# Patient Record
Sex: Female | Born: 1970 | Race: White | Hispanic: No | Marital: Married | Smoking: Never smoker
Health system: Southern US, Community
[De-identification: ages and names within clinical notes are randomized; demographics above are authoritative.]

## PROBLEM LIST (undated history)

## (undated) DIAGNOSIS — R238 Other skin changes: Secondary | ICD-10-CM

## (undated) DIAGNOSIS — M5430 Sciatica, unspecified side: Secondary | ICD-10-CM

## (undated) DIAGNOSIS — G479 Sleep disorder, unspecified: Secondary | ICD-10-CM

## (undated) DIAGNOSIS — G629 Polyneuropathy, unspecified: Secondary | ICD-10-CM

## (undated) DIAGNOSIS — K219 Gastro-esophageal reflux disease without esophagitis: Secondary | ICD-10-CM

## (undated) DIAGNOSIS — R531 Weakness: Secondary | ICD-10-CM

## (undated) DIAGNOSIS — M419 Scoliosis, unspecified: Secondary | ICD-10-CM

## (undated) DIAGNOSIS — D649 Anemia, unspecified: Secondary | ICD-10-CM

## (undated) DIAGNOSIS — R109 Unspecified abdominal pain: Secondary | ICD-10-CM

## (undated) DIAGNOSIS — R519 Headache, unspecified: Secondary | ICD-10-CM

## (undated) DIAGNOSIS — I10 Essential (primary) hypertension: Secondary | ICD-10-CM

## (undated) DIAGNOSIS — K5900 Constipation, unspecified: Secondary | ICD-10-CM

## (undated) DIAGNOSIS — F319 Bipolar disorder, unspecified: Secondary | ICD-10-CM

## (undated) DIAGNOSIS — M179 Osteoarthritis of knee, unspecified: Secondary | ICD-10-CM

## (undated) DIAGNOSIS — R233 Spontaneous ecchymoses: Secondary | ICD-10-CM

## (undated) DIAGNOSIS — M1710 Unilateral primary osteoarthritis, unspecified knee: Secondary | ICD-10-CM

## (undated) DIAGNOSIS — M359 Systemic involvement of connective tissue, unspecified: Secondary | ICD-10-CM

## (undated) DIAGNOSIS — R51 Headache: Secondary | ICD-10-CM

## (undated) DIAGNOSIS — E119 Type 2 diabetes mellitus without complications: Secondary | ICD-10-CM

## (undated) DIAGNOSIS — M7989 Other specified soft tissue disorders: Secondary | ICD-10-CM

## (undated) DIAGNOSIS — K429 Umbilical hernia without obstruction or gangrene: Secondary | ICD-10-CM

## (undated) HISTORY — PX: HERNIA MESH REMOVAL: SHX1745

## (undated) HISTORY — DX: Unspecified abdominal pain: R10.9

## (undated) HISTORY — DX: Headache: R51

## (undated) HISTORY — PX: HERNIA REPAIR: SHX51

## (undated) HISTORY — DX: Weakness: R53.1

## (undated) HISTORY — DX: Headache, unspecified: R51.9

## (undated) HISTORY — DX: Other specified soft tissue disorders: M79.89

## (undated) HISTORY — DX: Constipation, unspecified: K59.00

## (undated) HISTORY — DX: Other skin changes: R23.8

## (undated) HISTORY — PX: KNEE SURGERY: SHX244

## (undated) HISTORY — PX: FOOT SURGERY: SHX648

## (undated) HISTORY — PX: OTHER SURGICAL HISTORY: SHX169

## (undated) HISTORY — DX: Spontaneous ecchymoses: R23.3

## (undated) HISTORY — PX: ABDOMINAL HYSTERECTOMY: SHX81

---

## 2014-01-14 DIAGNOSIS — K429 Umbilical hernia without obstruction or gangrene: Secondary | ICD-10-CM | POA: Insufficient documentation

## 2014-01-14 DIAGNOSIS — I10 Essential (primary) hypertension: Secondary | ICD-10-CM | POA: Insufficient documentation

## 2014-01-14 DIAGNOSIS — E119 Type 2 diabetes mellitus without complications: Secondary | ICD-10-CM | POA: Insufficient documentation

## 2014-01-15 ENCOUNTER — Encounter (HOSPITAL_COMMUNITY): Payer: Self-pay | Admitting: Emergency Medicine

## 2014-01-15 ENCOUNTER — Emergency Department (HOSPITAL_COMMUNITY)
Admission: EM | Admit: 2014-01-15 | Discharge: 2014-01-15 | Discharge status: Against Medical Advice | Payer: Medicaid Other | Attending: Emergency Medicine | Admitting: Emergency Medicine

## 2014-01-15 HISTORY — DX: Scoliosis, unspecified: M41.9

## 2014-01-15 HISTORY — DX: Umbilical hernia without obstruction or gangrene: K42.9

## 2014-01-15 HISTORY — DX: Osteoarthritis of knee, unspecified: M17.9

## 2014-01-15 HISTORY — DX: Unilateral primary osteoarthritis, unspecified knee: M17.10

## 2014-01-15 HISTORY — DX: Essential (primary) hypertension: I10

## 2014-01-15 HISTORY — DX: Bipolar disorder, unspecified: F31.9

## 2014-01-15 LAB — COMPREHENSIVE METABOLIC PANEL
ALBUMIN: 3.9 g/dL (ref 3.5–5.2)
ALT: 17 U/L (ref 0–35)
AST: 15 U/L (ref 0–37)
Alkaline Phosphatase: 68 U/L (ref 39–117)
BUN: 11 mg/dL (ref 6–23)
CALCIUM: 9.2 mg/dL (ref 8.4–10.5)
CO2: 24 mEq/L (ref 19–32)
Chloride: 102 mEq/L (ref 96–112)
Creatinine, Ser: 0.7 mg/dL (ref 0.50–1.10)
GFR calc non Af Amer: >90 mL/min (ref >90)
Glucose, Bld: 99 mg/dL (ref 70–99)
Potassium: 3.7 mEq/L (ref 3.7–5.3)
SODIUM: 139 meq/L (ref 137–147)
TOTAL PROTEIN: 7.9 g/dL (ref 6.0–8.3)
Total Bilirubin: 0.2 mg/dL — ABNORMAL LOW (ref 0.3–1.2)

## 2014-01-15 LAB — CBC WITH DIFFERENTIAL/PLATELET
BASOS PCT: 1 % (ref 0–1)
Basophils Absolute: 0.1 10*3/uL (ref 0.0–0.1)
EOS ABS: 0.2 10*3/uL (ref 0.0–0.7)
EOS PCT: 2 % (ref 0–5)
HCT: 35.4 % — ABNORMAL LOW (ref 36.0–46.0)
Hemoglobin: 11.9 g/dL — ABNORMAL LOW (ref 12.0–15.0)
LYMPHS ABS: 2.6 10*3/uL (ref 0.7–4.0)
Lymphocytes Relative: 31 % (ref 12–46)
MCH: 28.3 pg (ref 26.0–34.0)
MCHC: 33.6 g/dL (ref 30.0–36.0)
MCV: 84.3 fL (ref 78.0–100.0)
Monocytes Absolute: 0.8 10*3/uL (ref 0.1–1.0)
Monocytes Relative: 9 % (ref 3–12)
Neutro Abs: 4.7 10*3/uL (ref 1.7–7.7)
Neutrophils Relative %: 57 % (ref 43–77)
PLATELETS: 225 10*3/uL (ref 150–400)
RBC: 4.2 MIL/uL (ref 3.87–5.11)
RDW: 13.2 % (ref 11.5–15.5)
WBC: 8.3 10*3/uL (ref 4.0–10.5)

## 2014-01-15 LAB — URINALYSIS, ROUTINE W REFLEX MICROSCOPIC
Bilirubin Urine: NEGATIVE
Glucose, UA: NEGATIVE mg/dL
HGB URINE DIPSTICK: NEGATIVE
Ketones, ur: NEGATIVE mg/dL
Leukocytes, UA: NEGATIVE
Nitrite: NEGATIVE
Protein, ur: NEGATIVE mg/dL
SPECIFIC GRAVITY, URINE: 1.017 (ref 1.005–1.030)
UROBILINOGEN UA: 0.2 mg/dL (ref 0.0–1.0)
pH: 6.5 (ref 5.0–8.0)

## 2014-01-15 LAB — LIPASE, BLOOD: Lipase: 36 U/L (ref 11–59)

## 2014-01-15 LAB — PREGNANCY, URINE: PREG TEST UR: NEGATIVE

## 2014-01-15 NOTE — ED Notes (Signed)
Pt not in room when going in. Pt gown laying on bed. Gave time to see if pt would present back in room. Pt not in room. PA made aware.

## 2014-01-15 NOTE — ED Notes (Signed)
Pt reports having mesh removed in June and developing 3 umbilical hernias. Pt reports pain to umbilical pain to area, unable to sleep d/t to pain. Pt alert and family in room.

## 2014-02-07 ENCOUNTER — Encounter (HOSPITAL_COMMUNITY): Payer: Self-pay | Admitting: Emergency Medicine

## 2014-02-07 ENCOUNTER — Emergency Department (HOSPITAL_COMMUNITY)
Admission: EM | Admit: 2014-02-07 | Discharge: 2014-02-07 | Disposition: A | Discharge status: Home / Self Care | Payer: Medicaid Other | Attending: Emergency Medicine | Admitting: Emergency Medicine

## 2014-02-07 DIAGNOSIS — I10 Essential (primary) hypertension: Secondary | ICD-10-CM | POA: Insufficient documentation

## 2014-02-07 DIAGNOSIS — M79622 Pain in left upper arm: Secondary | ICD-10-CM

## 2014-02-07 DIAGNOSIS — IMO0002 Reserved for concepts with insufficient information to code with codable children: Secondary | ICD-10-CM | POA: Insufficient documentation

## 2014-02-07 DIAGNOSIS — E119 Type 2 diabetes mellitus without complications: Secondary | ICD-10-CM | POA: Insufficient documentation

## 2014-02-07 DIAGNOSIS — M1710 Unilateral primary osteoarthritis, unspecified knee: Secondary | ICD-10-CM | POA: Insufficient documentation

## 2014-02-07 DIAGNOSIS — M79609 Pain in unspecified limb: Secondary | ICD-10-CM | POA: Insufficient documentation

## 2014-02-07 DIAGNOSIS — Z87891 Personal history of nicotine dependence: Secondary | ICD-10-CM | POA: Insufficient documentation

## 2014-02-07 DIAGNOSIS — Z880 Allergy status to penicillin: Secondary | ICD-10-CM | POA: Insufficient documentation

## 2014-02-07 DIAGNOSIS — M4120 Other idiopathic scoliosis, site unspecified: Secondary | ICD-10-CM | POA: Insufficient documentation

## 2014-02-07 DIAGNOSIS — R209 Unspecified disturbances of skin sensation: Secondary | ICD-10-CM | POA: Insufficient documentation

## 2014-02-07 DIAGNOSIS — F319 Bipolar disorder, unspecified: Secondary | ICD-10-CM | POA: Insufficient documentation

## 2014-02-07 MED ORDER — OXYCODONE-ACETAMINOPHEN 5-325 MG PO TABS
2.0000 | ORAL_TABLET | Freq: Once | ORAL | Status: AC
  Administered 2014-02-07: 2 via ORAL
  Filled 2014-02-07: qty 2

## 2014-02-07 MED ORDER — OXYCODONE-ACETAMINOPHEN 5-325 MG PO TABS: 1.0000 | ORAL_TABLET | ORAL | Status: DC | PRN

## 2014-02-07 MED ORDER — IBUPROFEN 200 MG PO TABS
600.0000 mg | ORAL_TABLET | Freq: Once | ORAL | Status: AC
  Administered 2014-02-07: 600 mg via ORAL
  Filled 2014-02-07: qty 3

## 2014-02-07 NOTE — Discharge Instructions (Signed)
You need to follow-up for your ultrasound tomorrow. See contact information for [facility]. See below resources for primary care.    Emergency Department Resource Guide 1) Find a Doctor and Pay Out of Pocket Although you won't have to find out who is covered by your insurance plan, it is a good idea to ask around and get recommendations. You will then need to call the office and see if the doctor you have chosen will accept you as a new patient and what types of options they offer for patients who are self-pay. Some doctors offer discounts or will set up payment plans for their patients who do not have insurance, but you will need to ask so you aren't surprised when you get to your appointment.  2) Contact Your Local Health Department Not all health departments have doctors that can see patients for sick visits, but many do, so it is worth a call to see if yours does. If you don't know where your local health department is, you can check in your phone book. The CDC also has a tool to help you locate your state's health department, and many state websites also have listings of all of their local health departments.  3) Find a Walk-in Clinic If your illness is not likely to be very severe or complicated, you may want to try a walk in clinic. These are popping up all over the country in pharmacies, drugstores, and shopping centers. They're usually staffed by nurse practitioners or physician assistants that have been trained to treat common illnesses and complaints. They're usually fairly quick and inexpensive. However, if you have serious medical issues or chronic medical problems, these are probably not your best option.  No Primary Care Doctor: - Call Health Connect at  [phone] - they can help you locate a primary care doctor that  accepts your insurance, provides certain services, etc. - Physician Referral Service- [phone]  Chronic Pain Problems: Organization          Address  Phone   Notes  [facility]  [phone]) [phone] Patients need to be referred by their primary care doctor.   Medication Assistance: Organization         Address  Phone   Notes  [facility]ication Primary [facility] Program [address] [place], [place] [address] [phone] --Must be a resident of [facility] -- Must have NO insurance coverage whatsoever (no Medicaid/ Medicare, etc.) -- The pt. MUST have a primary care doctor that directs their care regularly and follows them in the community   MedAssist  [phone]) [phone]   [employer]  [phone]    Agencies that provide inexpensive medical care: Organization         Address  Phone   Notes  [facility]  [phone]) [phone]   [facility]    [phone]) [phone]   [facility] [address] [place], [place] [address] [phone]   [facility] [address], [place] [phone]) [phone]   Planned Parenthood    [phone](336) [phone]   [facility]    [phone]   [facility]  [address], [place] Phone:  [phone], Fax:  [phone] Hours of Operation:  9 am - 6 pm, M-F.  Also accepts Medicaid/Medicare and self-pay.  [facility]  [address], [place] Phone: [phone](336) [phone],  Fax: [phone]. Hours of Operation:  8:30 am - 5:30 pm, M-F.  Also accepts Medicaid and self-pay.  [facility] [address], [place] Phone: [phone]   [place], [place], [place] [phone], Ext. [phone] Mondays & Thursdays: 7-9 AM.  First 15 patients are seen on a first come, first serve basis.    [place] Providers:  Organization         Address  Phone   Notes  [place] [address], [place] [phone] Also accepts self-pay patients.  [facility] [address], [place]  [phone]   [place], [address], [place] [phone]   [facility] [address] [place] [phone]   Lucianne Lei [address], [place]   [phone] Only accepts [place] Access [place] patients after they have their name applied to their card.   Self-Pay (no insurance) in [facility]:  Organization         Address  Phone   Notes  Sickle Cell Patients, [facility] [place] [phone]   [facility] [place] [phone]   [facility] [place]  [place], [place], [place] [phone]   [facility]/Dr. Osei-Bonsu  [address], [place] or [address], [place] [phone] Phone number for both [place] and [place] locations is the same.  [facility] [address], [place] 5 [phone]   [facility] [address] [place] or [address] [phone]   [facility] [address] [place] [phone], phone; [phone], fax Sees patients 1st and 3rd Saturday of every month.  Must not qualify for public or private insurance (i.e. Medicaid, Medicare, [place] Health Choice, Veterans' Benefits)  Household income should be no more than 200% of the poverty level The clinic cannot treat you if you are pregnant or think you are pregnant  Sexually transmitted diseases are not treated at the clinic.    Dental Care: Organization         Address  Phone  Notes  [facility] [place] [phone] Accepts children up to age 36 who are enrolled in [place] or [place]; pregnant women with a Medicaid card; and  children who have applied for Medicaid or [place] Health Choice, but were declined, whose parents can pay a reduced fee at time of service.  [facility] Department of [facility]  [address], [place] [phone] Accepts children up to age 67 who are enrolled in [place] or [place]; pregnant women with a Medicaid card; and children who have applied for Medicaid or [place] Health Choice, but were declined, whose parents can pay a reduced fee at time of service.  [place] Adult Dental Access PROGRAM  [place] [phone] Patients are seen by appointment only. Walk-ins are not accepted. [place] will see patients 26 years of age and older. Monday - Tuesday (8am-5pm) Most Wednesdays (8:30-5pm) $30 per visit, cash only  [place] [facility] [employer] PROGRAM  [address], [employer] (  [phone] Patients are seen by appointment only. Walk-ins are not accepted. [place] will see patients 54 years of age and older. One Wednesday Evening (Monthly: Volunteer Based).  $30 per visit, cash only  [place]  [phone] for adults; Children under age 63, call Graduate Pediatric Dentistry at [phone]. Children aged 44-14, please call [phone] to request a pediatric application.  Dental services are provided in all areas of dental care including fillings, crowns and bridges, complete and partial dentures, implants, gum treatment, root canals, and extractions. Preventive care is also provided. Treatment is provided to both adults and children. Patients are selected via a lottery and there is often a waiting list.   [facility] [address] [place]  [phone] [email or web address]   [facility] [address] [place], [place] [phone], Ext. [phone] Second and Fourth Thursday of each month, opens at 6:30 AM; Clinic ends at 9 AM.  Patients are seen on a first-come first-served  basis, and a limited number are seen during each clinic.   [facility]  [address] Hillard Danker [place], [place] [phone]   Eligibility Requirements You must have lived in [place], [place], or [place] counties for at least the last three months.   You cannot be eligible for state or federal sponsored [employer], including [employer], [place], or [employer].   You generally cannot be eligible for healthcare insurance through your employer.    How to apply: Eligibility screenings are held every Tuesday and Wednesday afternoon from 1:00 pm until 4:00 pm. You do not need an appointment for the interview!  [facility] [address], [place], [place]   [place]  [place] Department  [place]  [phone]    Behavioral Health Resources in the Community: Intensive Outpatient Programs Organization         Address  Phone  Notes  [place]. [address], [place], [place] [phone]   [facility] [address] [place], [place]   ADS: Alcohol & Drug Svcs [address], [place], [place]   [place] [address]  [place], [place] or [phone]   Substance Abuse Resources Organization         Address  Phone  Notes  Alcohol and Drug Services  [phone]   [place]  [phone]   The [place]   Chinita Pester  [phone]   Residential & Outpatient Substance Abuse Program  [phone]   Psychological Services Organization         Address  Phone  Notes  [facility] [place]  [place]  [phone]   [place] [address], [place] or [phone]    Mobile Crisis Teams Organization          Address  Phone  Notes  [facility] Alternatives, Mobile Crisis Care Unit  [phone]   [facility]ive Psychotherapeutic Services  [address] [place], [place]   [place] [address] [place] [phone]    Self-Help/Support Groups Organization         Address  Phone             Notes  [place]. of [place] - variety of support groups  336- H3156881 Call for more information  Narcotics Anonymous (NA), [facility] [address], [employer] [place]  2 meetings at this location   Residential Facilities manager         Address  Phone  Notes  [facility] [place],    [place]  [phone]   [facility]  [address], [place] [id number], [place], [place]   [place], [place] [phone] Admissions: 8am-3pm M-F  Incentives Substance [place] [address]    [employer], [place] [phone]   [facility] [address] [place], [place], [place]   [facility] [address],  [place], [place]   [facility] Programs - Intensive Outpatient [address] Kristeen [address], [place], [place]   [facility] ([place].,  [place], [place] [phone] or [phone]   [facility] Services (RTS) [address], [place], [place] Accepts Medicaid  [facility] [address]  [place] [phone] Substance Abuse/Addiction Treatment   [facility] Organization         Address  Phone  Notes  [facility]  [phone]   Domenic Schwab, PhD [address] Arlis Porta [place], [place]   [phone] or [phone]   [place], [place] [phone]   [facility] [address], [place], [place] [phone] Insurance/Medicaid/sponsorship  through [facility] Families [address]                                    [place], [place] [phone] [place] [address], [place] [phone]    Dr. Adele Schilder  [phone]   [facility] Dept. 1) [address] [place] 2) [place] 3)  [place] [address], [place] [phone]  [phone]   [place] [phone] or [phone] (After Hours)

## 2014-02-07 NOTE — ED Notes (Signed)
Pt presents with c/o abdominal pain, extremity numbness, and left arm pain. Pt had abdominal surgery 06/14 to take mesh out that was recalled and remove some abdominal hernias. Pt says she is having pain in the area where the surgery was done. Pt also c/o numbness and tingling in her toes that has been going on for about 2 weeks. Pt also concerned about a possible infection in her upper left arm. Pt says her husband noticed a red streak coming down her arm. Pt unsure where the pain is coming from.

## 2014-02-08 ENCOUNTER — Encounter (HOSPITAL_COMMUNITY): Payer: Self-pay | Admitting: Emergency Medicine

## 2014-02-08 ENCOUNTER — Emergency Department (HOSPITAL_COMMUNITY)
Admission: EM | Admit: 2014-02-08 | Discharge: 2014-02-09 | Disposition: A | Discharge status: Home / Self Care | Payer: Medicaid Other | Attending: Emergency Medicine | Admitting: Emergency Medicine

## 2014-02-08 ENCOUNTER — Ambulatory Visit (HOSPITAL_COMMUNITY): Admission: RE | Admit: 2014-02-08 | Payer: Medicaid Other | Source: Ambulatory Visit

## 2014-02-08 DIAGNOSIS — E119 Type 2 diabetes mellitus without complications: Secondary | ICD-10-CM | POA: Insufficient documentation

## 2014-02-08 DIAGNOSIS — Z791 Long term (current) use of non-steroidal anti-inflammatories (NSAID): Secondary | ICD-10-CM | POA: Insufficient documentation

## 2014-02-08 DIAGNOSIS — Z880 Allergy status to penicillin: Secondary | ICD-10-CM | POA: Insufficient documentation

## 2014-02-08 DIAGNOSIS — Z87891 Personal history of nicotine dependence: Secondary | ICD-10-CM | POA: Insufficient documentation

## 2014-02-08 DIAGNOSIS — M1710 Unilateral primary osteoarthritis, unspecified knee: Secondary | ICD-10-CM | POA: Insufficient documentation

## 2014-02-08 DIAGNOSIS — M79609 Pain in unspecified limb: Secondary | ICD-10-CM | POA: Insufficient documentation

## 2014-02-08 DIAGNOSIS — Z8719 Personal history of other diseases of the digestive system: Secondary | ICD-10-CM | POA: Insufficient documentation

## 2014-02-08 DIAGNOSIS — M79602 Pain in left arm: Secondary | ICD-10-CM

## 2014-02-08 DIAGNOSIS — Z8659 Personal history of other mental and behavioral disorders: Secondary | ICD-10-CM | POA: Insufficient documentation

## 2014-02-08 DIAGNOSIS — R519 Headache, unspecified: Secondary | ICD-10-CM

## 2014-02-08 DIAGNOSIS — I10 Essential (primary) hypertension: Secondary | ICD-10-CM | POA: Insufficient documentation

## 2014-02-08 DIAGNOSIS — IMO0002 Reserved for concepts with insufficient information to code with codable children: Secondary | ICD-10-CM | POA: Insufficient documentation

## 2014-02-08 DIAGNOSIS — H53149 Visual discomfort, unspecified: Secondary | ICD-10-CM | POA: Insufficient documentation

## 2014-02-08 DIAGNOSIS — R51 Headache: Secondary | ICD-10-CM | POA: Insufficient documentation

## 2014-02-08 MED ORDER — SODIUM CHLORIDE 0.9 % IV BOLUS (SEPSIS)
1000.0000 mL | Freq: Once | INTRAVENOUS | Status: AC
  Administered 2014-02-09: 1000 mL via INTRAVENOUS

## 2014-02-08 MED ORDER — FENTANYL CITRATE 0.05 MG/ML IJ SOLN
50.0000 ug | Freq: Once | INTRAMUSCULAR | Status: AC
  Administered 2014-02-09: 50 ug via INTRAVENOUS
  Filled 2014-02-08: qty 2

## 2014-02-08 MED ORDER — ONDANSETRON HCL 4 MG/2ML IJ SOLN
4.0000 mg | Freq: Once | INTRAMUSCULAR | Status: AC
  Administered 2014-02-09: 4 mg via INTRAVENOUS
  Filled 2014-02-08: qty 2

## 2014-02-08 NOTE — ED Notes (Signed)
Pt reports [facility] and EDP suggested she may have a blood clot in her left arm. Was scheduled for vascular study this morning but missed her appt. Pt reports she called and made appt for Monday but now she feels more dizzy and lightheaded and has vomited about 5 times with increasing pain to left arm. Left arm appears swollen at [facility]. Cap refill <3 and radial pulse palpable.

## 2014-02-08 NOTE — ED Provider Notes (Signed)
CSN: [id number]     Arrival date & time 02/08/14  2132 History   First MD Initiated Contact with Patient 02/08/14 2325     Chief Complaint  Patient presents with  . Arm Pain     (Consider location/radiation/quality/duration/timing/severity/associated sxs/prior Treatment) HPI  Patient is a 43 yo woman with DM, HTN, bipolar disorder. She presents after vomiting about 6 times throughout the day today with difficulty keeping po intake.  She denies abdominal pain and diarrhea. [facility] and was given "a pill". She thinks the nurse that gave her the pill dropped the pill on the ground. Denies GUsx.   Patient says [facility] after being evaluated for left arm pain and was told to come back for a  Vascular u/s this morning. However, she overslept and missed the appt. She says she needs the u/s. She says she can feel a lump in her arm. She denies h/o trauma. Pain is 3/10, intermittent. She says her husband thought that her veins were "popping out" and that her husband thought she had a blood clot and needed to go to the hospital.   Patient also complains of a migraine headache which began about 4h ago. She has mild photophobia.   Past Medical History  Diagnosis Date  . Umbilical hernia   . Diabetes mellitus without complication   . Hypertension   . Bipolar disorder   . Scoliosis   . OA (osteoarthritis) of knee    Past Surgical History  Procedure Laterality Date  . Hernia mesh removal    . Abdominal hysterectomy    . Knee surgery     No family history on file. History  Substance Use Topics  . Smoking status: Former Games developermoker  . Smokeless tobacco: Not on file  . Alcohol Use: No   OB History   Grav Para Term Preterm Abortions TAB SAB Ect Mult Living                 Review of Systems  Ten point review of symptoms performed and is negative with the exception of symptoms noted above.    Allergies  Celebrex; Keflex; Penicillins;  and Ultram  Home Medications   Current Outpatient Rx  Name  Route  Sig  Dispense  Refill  . hydrOXYzine (ATARAX/VISTARIL) 25 MG tablet   Oral   Take 25 mg by mouth at bedtime.         . naproxen (NAPROSYN) 500 MG tablet   Oral   Take 500 mg by mouth 2 (two) times daily with a meal.         . oxyCODONE-acetaminophen (PERCOCET/ROXICET) 5-325 MG per tablet   Oral   Take 1-2 tablets by mouth every 4 (four) hours as needed for severe pain.   12 tablet   0    BP 130/77  Pulse 74  Temp(Src) 98.1 F (36.7 C) (Oral)  Resp 18  Ht 5\' 4"  (1.626 m)  Wt 211 lb 3 oz (95.794 kg)  BMI 36.23 kg/m2  SpO2 100% Physical Exam Gen: well developed and well nourished appearing Head: NCAT Eyes: PERL, EOMI Nose: no epistaixis or rhinorrhea Mouth/throat: mucosa is moist and pink, edentulous Neck: supple, no stridor Lungs: CTA B, no wheezing, rhonchi or rales CV: RRR, no murmur, extremities appear well perfused.  Abd: soft, notender, nondistended Back: no ttp, no cva ttp Skin: warm and dry Ext: normal to inspection, no masses appreciated, ttp  over the distal anterior left upper arm, skin without lesions, FROM without pain right shoulder, elbow, wrist. no dependent edema Neuro: CN ii-xii grossly intact, no focal deficits Psyche;  flataffect,  calm and cooperative.   ED Course  Procedures (including critical care time)   MDM   Patient with complaint of migraine headache and left arm pain with ttp and personal suspicion for a DVT. The patient has no risk factors for a DVT and I see no clinical signs to make me suspect DVT. We will manage headache related pain with IVF, fentanyl, Zofran.  Patient will need to return later in the day for a duplex u/s of the left arm to rule out DVT.    Brandt Loosen, MD 02/09/14 [phone]

## 2014-02-08 NOTE — ED Notes (Signed)
The pt has had lt arm pain for 3 days.  [facility] and she was supposed to have a us this am but she missed her appointment this am. She thinks her lt arm is more swollen and she has been vomiting since she has been taking the percocet for pain

## 2014-02-09 ENCOUNTER — Ambulatory Visit (HOSPITAL_COMMUNITY)
Admission: EM | Admit: 2014-02-09 | Discharge: 2014-02-09 | Disposition: A | Discharge status: Home / Self Care | Payer: Medicaid Other | Source: Other Acute Inpatient Hospital | Attending: Emergency Medicine | Admitting: Emergency Medicine

## 2014-02-09 NOTE — ED Notes (Signed)
Pt A&Ox4, ambulatory at discharge, verbalizing no complaints at this time. 

## 2014-02-09 NOTE — Discharge Instructions (Signed)
PLEASE RETURN TO THE  HOSPITAL REGISTRATION DESK AT 10AM FOR VASCULAR ULTRASOUND OF THE LEFT ARM.

## 2014-02-11 ENCOUNTER — Ambulatory Visit (HOSPITAL_COMMUNITY): Payer: Medicaid Other | Attending: Emergency Medicine

## 2014-02-13 NOTE — ED Provider Notes (Signed)
CSN: [id number]     Arrival date & time 02/07/14  1923 History   First MD Initiated Contact with Patient 02/07/14 1947     Chief Complaint  Patient presents with  . Abdominal Pain  . Extremity numbness   . Arm Pain     (Consider location/radiation/quality/duration/timing/severity/associated sxs/prior Treatment) HPI 43 year old female multiple complaints. Chief complaint seems to be atraumatic left upper extremity pain. She reports that her husband pointed out that her left upper arm appear to be swollen and red streaks extending up it. She felt a mass in her antecubital area which he says has since resolved. Redness has improved. She still having a heat pain extending from just above her elbow radiating to her shoulder region. Denies any history of blood clot. No fevers or chills. No chest pain or shortness of breath. No unusual leg pain or swelling.   Past Medical History  Diagnosis Date  . Umbilical hernia   . Diabetes mellitus without complication   . Hypertension   . Bipolar disorder   . Scoliosis   . OA (osteoarthritis) of knee    Past Surgical History  Procedure Laterality Date  . Hernia mesh removal    . Abdominal hysterectomy    . Knee surgery     No family history on file. History  Substance Use Topics  . Smoking status: Former Games developermoker  . Smokeless tobacco: Not on file  . Alcohol Use: No   OB History   Grav Para Term Preterm Abortions TAB SAB Ect Mult Living                 Review of Systems  All systems reviewed and negative, other than as noted in HPI.   Allergies  Celebrex; Keflex; Penicillins; and Ultram  Home Medications   Prior to Admission medications   Medication Sig Start Date End Date Taking? Authorizing Provider  hydrOXYzine (ATARAX/VISTARIL) 25 MG tablet Take 25 mg by mouth at bedtime.   Yes Historical Provider, MD  naproxen (NAPROSYN) 500 MG tablet Take 500 mg by mouth 2 (two) times daily with a meal.   Yes Historical Provider, MD   oxyCODONE-acetaminophen (PERCOCET/ROXICET) 5-325 MG per tablet Take 1-2 tablets by mouth every 4 (four) hours as needed for severe pain. 02/07/14   Raeford RazorStephen Katriel Cutsforth, MD   BP 121/85  Pulse 87  Temp(Src) 98.7 F (37.1 C) (Oral)  Resp 18  SpO2 99% Physical Exam  Nursing note and vitals reviewed. Constitutional: She appears well-developed and well-nourished. No distress.  HENT:  Head: Normocephalic and atraumatic.  Eyes: Conjunctivae are normal. Right eye exhibits no discharge. Left eye exhibits no discharge.  Neck: Neck supple.  Cardiovascular: Normal rate, regular rhythm and normal heart sounds.  Exam reveals no gallop and no friction rub.   No murmur heard. Pulmonary/Chest: Effort normal and breath sounds normal. No respiratory distress.  Abdominal: Soft. She exhibits no distension. There is no tenderness.  Well-healed abdominal incisions. No tenderness. No distention.  Musculoskeletal: She exhibits no edema and no tenderness.  Left upper extremity is symmetric as compared to the right. No redness/streaking or other concerning skin changes noted. Radian, median and ulnar function is intact. Brisk cap refill of the fingers. Strength is 5 out of 5 with arm flexion and extension. Do not feel any mass or palpate any cords.  Neurological: She is alert.  Skin: Skin is warm and dry.  Psychiatric: She has a normal mood and affect. Her behavior is normal. Thought content normal.  ED Course  Procedures (including critical care time) Labs Review Labs Reviewed - No data to display  Imaging Review No results found.   EKG Interpretation None      MDM   Final diagnoses:  Left upper arm pain    43 year old female with atraumatic left upper extremity pain. She is a concern for possible blood clot. Clinically there are no signs of this. Unfortunately, unable to obtain a duplex ultrasound at this time a day. Arranged for patient return tomorrow to obtain this.    Raeford RazorStephen Wissam Resor,  MD 02/13/14 [phone]

## 2014-04-03 ENCOUNTER — Telehealth: Payer: Self-pay | Admitting: General Practice

## 2014-04-03 DIAGNOSIS — F419 Anxiety disorder, unspecified: Secondary | ICD-10-CM | POA: Insufficient documentation

## 2014-04-03 NOTE — Telephone Encounter (Signed)
Left message for patient to call to schedule appointment to establish care.  °

## 2014-04-22 ENCOUNTER — Emergency Department (HOSPITAL_COMMUNITY)
Admission: EM | Admit: 2014-04-22 | Discharge: 2014-04-22 | Discharge status: Against Medical Advice | Payer: Medicaid Other | Attending: Emergency Medicine | Admitting: Emergency Medicine

## 2014-04-22 ENCOUNTER — Encounter (HOSPITAL_COMMUNITY): Payer: Self-pay | Admitting: Emergency Medicine

## 2014-04-22 DIAGNOSIS — K429 Umbilical hernia without obstruction or gangrene: Secondary | ICD-10-CM | POA: Insufficient documentation

## 2014-04-22 DIAGNOSIS — E119 Type 2 diabetes mellitus without complications: Secondary | ICD-10-CM | POA: Insufficient documentation

## 2014-04-22 DIAGNOSIS — I10 Essential (primary) hypertension: Secondary | ICD-10-CM | POA: Insufficient documentation

## 2014-04-22 NOTE — ED Notes (Signed)
Pt states "I am going to go home, the wait is too long"; RN advised that we will be glad to see her and would like her to stay; pt declines to stay; pt aware of risks of leaving AMA

## 2014-04-22 NOTE — ED Notes (Signed)
Pt presents with c/o umbilical hernia that is causing her pain. Pt had abdominal surgery in June of last year. Pt started to notice a hernia about 4 months after the surgery. Pt recently noticed the hernia was red and swollen and tender to touch and was told to come here by her doctor.

## 2014-04-23 ENCOUNTER — Telehealth (INDEPENDENT_AMBULATORY_CARE_PROVIDER_SITE_OTHER): Payer: Self-pay

## 2014-04-23 ENCOUNTER — Ambulatory Visit (INDEPENDENT_AMBULATORY_CARE_PROVIDER_SITE_OTHER): Payer: Medicaid Other | Admitting: Surgery

## 2014-04-23 NOTE — Telephone Encounter (Signed)
Person called stating she is having severe pain at hernia site with redness and bulging. She states she was in the ER last night and waited 5 hours then left without being seen. I advised pt to go back to ER to have hernia evaluated to r/o incarceration. I stressed the importance of staying to be seen. She states she understands and will go back to ER.

## 2014-05-01 DIAGNOSIS — R202 Paresthesia of skin: Secondary | ICD-10-CM | POA: Insufficient documentation

## 2014-05-01 DIAGNOSIS — M25579 Pain in unspecified ankle and joints of unspecified foot: Secondary | ICD-10-CM | POA: Insufficient documentation

## 2014-05-07 DIAGNOSIS — K219 Gastro-esophageal reflux disease without esophagitis: Secondary | ICD-10-CM | POA: Insufficient documentation

## 2014-05-07 DIAGNOSIS — R768 Other specified abnormal immunological findings in serum: Secondary | ICD-10-CM | POA: Insufficient documentation

## 2014-05-10 ENCOUNTER — Other Ambulatory Visit (INDEPENDENT_AMBULATORY_CARE_PROVIDER_SITE_OTHER): Payer: Self-pay

## 2014-05-10 ENCOUNTER — Encounter (INDEPENDENT_AMBULATORY_CARE_PROVIDER_SITE_OTHER): Payer: Self-pay | Admitting: Surgery

## 2014-05-10 ENCOUNTER — Ambulatory Visit (INDEPENDENT_AMBULATORY_CARE_PROVIDER_SITE_OTHER): Payer: Medicaid Other | Admitting: Surgery

## 2014-05-10 VITALS — BP 110/78 | HR 64 | Temp 98.2°F | Resp 14 | Ht 63.5 in | Wt 205.0 lb

## 2014-05-10 DIAGNOSIS — K439 Ventral hernia without obstruction or gangrene: Secondary | ICD-10-CM

## 2014-05-10 DIAGNOSIS — K429 Umbilical hernia without obstruction or gangrene: Secondary | ICD-10-CM

## 2014-05-10 DIAGNOSIS — E119 Type 2 diabetes mellitus without complications: Secondary | ICD-10-CM | POA: Insufficient documentation

## 2014-05-10 DIAGNOSIS — K469 Unspecified abdominal hernia without obstruction or gangrene: Secondary | ICD-10-CM | POA: Insufficient documentation

## 2014-05-10 MED ORDER — OXYCODONE-ACETAMINOPHEN 5-325 MG PO TABS: 1.0000 | ORAL_TABLET | ORAL | Status: DC | PRN

## 2014-05-10 NOTE — Progress Notes (Signed)
Chief Complaint:  Pain in recurrent hernia  History of Present Illness:  Jennifer Barajas is an 43 y.o. female who had an 8 cm round Ventralex [place] in [place].  She developed a recurrent and pain and the mesh was [place] in [place] in 2014.  She has been seen and is complaining of pain and a bulge below these hernia repair attempts.    Past Medical History  Diagnosis Date  . Umbilical hernia   . Diabetes mellitus without complication   . Hypertension   . Bipolar disorder   . Scoliosis   . OA (osteoarthritis) of knee   . Abdominal pain   . Generalized headaches   . Leg swelling   . Constipation   . Diarrhea   . Nausea & vomiting   . Weakness   . Easy bruising     Past Surgical History  Procedure Laterality Date  . Hernia mesh removal    . Abdominal hysterectomy    . Knee surgery      Current Outpatient Prescriptions  Medication Sig Dispense Refill  . hydrOXYzine (ATARAX/VISTARIL) 25 MG tablet Take 25 mg by mouth at bedtime.       No current facility-administered medications for this visit.   Celebrex; Keflex; Penicillins; and Ultram History reviewed. No pertinent family history. Social History:   reports that she has quit smoking. She does not have any smokeless tobacco history on file. She reports that she does not drink alcohol or use illicit drugs.   REVIEW OF SYSTEMS : Positive for constipation since her hernia repair and see problem list ; otherwise negative  Physical Exam:   Blood pressure 110/78, pulse 64, temperature 98.2 F (36.8 C), temperature source Oral, resp. rate 14, height 5' 3.5" (1.613 m), weight 205 lb (92.987 kg). Body mass index is 35.74 kg/(m^2).  Gen:  WDWN WF NAD  Neurological: Alert and oriented to person, place, and time. Motor and sensory function is grossly intact  Head: Normocephalic and atraumatic.  Eyes: Conjunctivae are normal. Pupils are equal, round, and reactive to light. No scleral icterus.  Neck: Normal  range of motion. Neck supple. No tracheal deviation or thyromegaly present.  Cardiovascular:  SR without murmurs or gallops.  No carotid bruits Breast:  Not examined Respiratory: Effort normal.  No respiratory distress. No chest wall tenderness. Breath sounds normal.  No wheezes, rales or rhonchi.  Abdomen:  Tender bulge below the transverse incision consistent with properitoneal fat GU:  Not examined Musculoskeletal: Normal range of motion. Extremities are nontender. No cyanosis, edema or clubbing noted Lymphadenopathy: No cervical, preauricular, postauricular or axillary adenopathy is present Skin: Skin is warm and dry. No rash noted. No diaphoresis. No erythema. No pallor. Pscyh: Normal mood and affect. Behavior is normal. Judgment and thought content normal.   LABORATORY RESULTS: No results found for this or any previous visit (from the past 48 hour(s)).   RADIOLOGY RESULTS: No results found.  Problem List: Patient Active Problem List   Diagnosis Date Noted  . Diabetes 05/10/2014  . recurrent ventral hernia 05/10/2014    Assessment & Plan: Recurrent ventral hernia.  Patient does not want mesh reimplanted and I informed her that she may have a higher incidence of recurrence.  Will obtain a CT to assess what is involved with this recurrent hernia and see back after the CT to discuss repair.     Matt B. Daphine DeutscherMartin, MD, [facility]  [facility] [phone] beeper [phone]  05/10/2014 12:10  PM

## 2014-05-10 NOTE — Patient Instructions (Signed)
Ventral Hernia  A ventral hernia (also called an incisional hernia) is a hernia that occurs at the site of a previous surgical cut (incision) in the abdomen. The abdominal wall spans from your lower chest down to your pelvis. If the abdominal wall is weakened from a surgical incision, a hernia can occur. A hernia is a bulge of bowel or muscle tissue pushing out on the weakened part of the abdominal wall. Ventral hernias can get bigger from straining or lifting.  Obese and older people are at higher risk for a ventral hernia. People who develop infections after surgery or require repeat incisions at the same site on the abdomen are also at increased risk.  CAUSES   A ventral hernia occurs because of weakness in the abdominal wall at an incision site.   SYMPTOMS   Common symptoms include:   A visible bulge or lump on the abdominal wall.   Pain or tenderness around the lump.   Increased discomfort if you cough or make a sudden movement.  If the hernia has blocked part of the intestine, a serious complication can occur (incarcerated or strangulated hernia). This can become a problem that requires emergency surgery because the blood flow to the blocked intestine may be cut off. Symptoms may include:   Feeling sick to your stomach (nauseous).   Throwing up (vomiting).   Stomach swelling (distention) or bloating.   Fever.   Rapid heartbeat.  DIAGNOSIS   Your caregiver will take a medical history and perform a physical exam. Various tests may be ordered, such as:   Blood tests.   Urine tests.   Ultrasonography.   X-rays.   Computed tomography (CT).  TREATMENT   Watchful waiting may be all that is needed for a smaller hernia that does not cause symptoms. Your caregiver may recommend the use of a supportive belt (truss) that helps to keep the abdominal wall intact. For larger hernias or those that cause pain, surgery to repair the hernia is usually recommended. If a hernia becomes strangulated, emergency surgery  needs to be done right away.  HOME CARE INSTRUCTIONS   Avoid putting pressure or strain on the abdominal area.   Avoid heavy lifting.   Use good body positioning for physical tasks. Ask your caregiver about proper body positioning.   Use a supportive belt as directed by your caregiver.   Maintain a healthy weight.   Eat foods that are high in fiber, such as whole grains, fruits, and vegetables. Fiber helps prevent difficult bowel movements (constipation).   Drink enough fluids to keep your urine clear or pale yellow.   Follow up with your caregiver as directed.  SEEK MEDICAL CARE IF:    Your hernia seems to be getting larger or more painful.  SEEK IMMEDIATE MEDICAL CARE IF:    You have abdominal pain that is sudden and sharp.   Your pain becomes severe.   You have repeated vomiting.   You are sweating a lot.   You notice a rapid heartbeat.   You develop a fever.  MAKE SURE YOU:    Understand these instructions.   Will watch your condition.   Will get help right away if you are not doing well or get worse.  Document Released: 10/04/2012 Document Reviewed: 10/04/2012  ExitCare Patient Information 2015 [employer]. This information is not intended to replace advice given to you by your health care provider. Make sure you discuss any questions you have with your health care provider.

## 2014-05-15 DIAGNOSIS — E559 Vitamin D deficiency, unspecified: Secondary | ICD-10-CM | POA: Insufficient documentation

## 2014-05-15 DIAGNOSIS — IMO0002 Reserved for concepts with insufficient information to code with codable children: Secondary | ICD-10-CM | POA: Insufficient documentation

## 2014-05-16 ENCOUNTER — Emergency Department (HOSPITAL_COMMUNITY)
Admission: EM | Admit: 2014-05-16 | Discharge: 2014-05-16 | Disposition: A | Discharge status: Home / Self Care | Payer: Medicaid Other | Attending: Emergency Medicine | Admitting: Emergency Medicine

## 2014-05-16 ENCOUNTER — Encounter (HOSPITAL_COMMUNITY): Payer: Self-pay

## 2014-05-16 ENCOUNTER — Telehealth (INDEPENDENT_AMBULATORY_CARE_PROVIDER_SITE_OTHER): Payer: Self-pay | Admitting: Surgery

## 2014-05-16 ENCOUNTER — Ambulatory Visit (HOSPITAL_COMMUNITY)
Admission: RE | Admit: 2014-05-16 | Discharge: 2014-05-16 | Disposition: A | Discharge status: Home / Self Care | Payer: Medicaid Other | Source: Ambulatory Visit | Attending: Family Medicine | Admitting: Family Medicine

## 2014-05-16 ENCOUNTER — Telehealth (INDEPENDENT_AMBULATORY_CARE_PROVIDER_SITE_OTHER): Payer: Self-pay

## 2014-05-16 ENCOUNTER — Emergency Department (HOSPITAL_COMMUNITY): Payer: Medicaid Other

## 2014-05-16 ENCOUNTER — Encounter (HOSPITAL_COMMUNITY): Payer: Self-pay | Admitting: Emergency Medicine

## 2014-05-16 DIAGNOSIS — Z87891 Personal history of nicotine dependence: Secondary | ICD-10-CM | POA: Insufficient documentation

## 2014-05-16 DIAGNOSIS — I10 Essential (primary) hypertension: Secondary | ICD-10-CM | POA: Diagnosis not present

## 2014-05-16 DIAGNOSIS — Z880 Allergy status to penicillin: Secondary | ICD-10-CM | POA: Diagnosis not present

## 2014-05-16 DIAGNOSIS — R1110 Vomiting, unspecified: Secondary | ICD-10-CM | POA: Insufficient documentation

## 2014-05-16 DIAGNOSIS — R1909 Other intra-abdominal and pelvic swelling, mass and lump: Secondary | ICD-10-CM | POA: Diagnosis not present

## 2014-05-16 DIAGNOSIS — R1033 Periumbilical pain: Secondary | ICD-10-CM | POA: Diagnosis present

## 2014-05-16 DIAGNOSIS — N83209 Unspecified ovarian cyst, unspecified side: Secondary | ICD-10-CM

## 2014-05-16 DIAGNOSIS — F319 Bipolar disorder, unspecified: Secondary | ICD-10-CM | POA: Insufficient documentation

## 2014-05-16 DIAGNOSIS — F411 Generalized anxiety disorder: Secondary | ICD-10-CM | POA: Diagnosis not present

## 2014-05-16 DIAGNOSIS — E119 Type 2 diabetes mellitus without complications: Secondary | ICD-10-CM | POA: Diagnosis not present

## 2014-05-16 DIAGNOSIS — M1710 Unilateral primary osteoarthritis, unspecified knee: Secondary | ICD-10-CM | POA: Insufficient documentation

## 2014-05-16 DIAGNOSIS — IMO0002 Reserved for concepts with insufficient information to code with codable children: Secondary | ICD-10-CM | POA: Diagnosis not present

## 2014-05-16 DIAGNOSIS — R509 Fever, unspecified: Secondary | ICD-10-CM | POA: Diagnosis not present

## 2014-05-16 DIAGNOSIS — R1084 Generalized abdominal pain: Secondary | ICD-10-CM

## 2014-05-16 DIAGNOSIS — K0820 Unspecified atrophy of edentulous alveolar ridge: Secondary | ICD-10-CM | POA: Insufficient documentation

## 2014-05-16 DIAGNOSIS — Z79899 Other long term (current) drug therapy: Secondary | ICD-10-CM | POA: Insufficient documentation

## 2014-05-16 DIAGNOSIS — K429 Umbilical hernia without obstruction or gangrene: Secondary | ICD-10-CM | POA: Insufficient documentation

## 2014-05-16 DIAGNOSIS — R112 Nausea with vomiting, unspecified: Secondary | ICD-10-CM

## 2014-05-16 LAB — CBC WITH DIFFERENTIAL/PLATELET
Basophils Absolute: 0 10*3/uL (ref 0.0–0.1)
Basophils Relative: 1 % (ref 0–1)
Eosinophils Absolute: 0.1 10*3/uL (ref 0.0–0.7)
Eosinophils Relative: 2 % (ref 0–5)
HCT: 33.9 % — ABNORMAL LOW (ref 36.0–46.0)
Hemoglobin: 11.7 g/dL — ABNORMAL LOW (ref 12.0–15.0)
Lymphocytes Relative: 28 % (ref 12–46)
Lymphs Abs: 1.7 10*3/uL (ref 0.7–4.0)
MCH: 28.2 pg (ref 26.0–34.0)
MCHC: 34.5 g/dL (ref 30.0–36.0)
MCV: 81.7 fL (ref 78.0–100.0)
Monocytes Absolute: 0.5 10*3/uL (ref 0.1–1.0)
Monocytes Relative: 9 % (ref 3–12)
Neutro Abs: 3.7 10*3/uL (ref 1.7–7.7)
Neutrophils Relative %: 60 % (ref 43–77)
PLATELETS: 176 10*3/uL (ref 150–400)
RBC: 4.15 MIL/uL (ref 3.87–5.11)
RDW: 13.1 % (ref 11.5–15.5)
WBC: 6 10*3/uL (ref 4.0–10.5)

## 2014-05-16 LAB — COMPREHENSIVE METABOLIC PANEL
ALT: 14 U/L (ref 0–35)
AST: 15 U/L (ref 0–37)
Albumin: 4 g/dL (ref 3.5–5.2)
Alkaline Phosphatase: 54 U/L (ref 39–117)
Anion gap: 14 (ref 5–15)
BUN: 13 mg/dL (ref 6–23)
CO2: 21 meq/L (ref 19–32)
Calcium: 9.4 mg/dL (ref 8.4–10.5)
Chloride: 103 mEq/L (ref 96–112)
Creatinine, Ser: 0.8 mg/dL (ref 0.50–1.10)
GFR calc Af Amer: >90 mL/min (ref >90)
Glucose, Bld: 96 mg/dL (ref 70–99)
Potassium: 4 mEq/L (ref 3.7–5.3)
Sodium: 138 mEq/L (ref 137–147)
Total Bilirubin: <0.2 mg/dL — ABNORMAL LOW (ref 0.3–1.2)
Total Protein: 7.3 g/dL (ref 6.0–8.3)

## 2014-05-16 LAB — LIPASE, BLOOD: Lipase: 32 U/L (ref 11–59)

## 2014-05-16 MED ORDER — SODIUM CHLORIDE 0.9 % IV BOLUS (SEPSIS)
1000.0000 mL | Freq: Once | INTRAVENOUS | Status: AC
  Administered 2014-05-16: 1000 mL via INTRAVENOUS

## 2014-05-16 MED ORDER — ONDANSETRON HCL 4 MG/2ML IJ SOLN
4.0000 mg | Freq: Once | INTRAMUSCULAR | Status: AC
  Administered 2014-05-16: 4 mg via INTRAVENOUS
  Filled 2014-05-16: qty 2

## 2014-05-16 MED ORDER — SODIUM CHLORIDE 0.9 % IV SOLN
INTRAVENOUS | Status: DC
  Administered 2014-05-16: 18:00:00 via INTRAVENOUS

## 2014-05-16 MED ORDER — MORPHINE SULFATE 4 MG/ML IJ SOLN
4.0000 mg | Freq: Once | INTRAMUSCULAR | Status: AC
  Administered 2014-05-16: 4 mg via INTRAVENOUS
  Filled 2014-05-16: qty 1

## 2014-05-16 MED ORDER — IOHEXOL 300 MG/ML  SOLN
100.0000 mL | Freq: Once | INTRAMUSCULAR | Status: AC | PRN
  Administered 2014-05-16: 100 mL via INTRAVENOUS

## 2014-05-16 MED ORDER — LORAZEPAM 2 MG/ML IJ SOLN
0.5000 mg | Freq: Once | INTRAMUSCULAR | Status: AC
  Administered 2014-05-16: 0.5 mg via INTRAVENOUS
  Filled 2014-05-16: qty 1

## 2014-05-16 MED ORDER — OXYCODONE-ACETAMINOPHEN 5-325 MG PO TABS: 1.0000 | ORAL_TABLET | ORAL | Status: DC | PRN

## 2014-05-16 MED ORDER — IOHEXOL 300 MG/ML  SOLN
25.0000 mL | Freq: Once | INTRAMUSCULAR | Status: AC | PRN
  Administered 2014-05-16: 25 mL via ORAL

## 2014-05-16 NOTE — Telephone Encounter (Signed)
Pt called in for Ct results. Patient states she is having increased pain after CT today at umbilical area and it is hard to touch. She did have a BM today. She denies n/v. Let patient know CT showed small fascial defects at umbilical area. She states it is painful to push bulge back in and it feels like it is getting more difficult to push in and sometimes it gets stuck. She cannot wait until f/u appt with Dr Daphine DeutscherMartin. Advised pt she should go to ED for evaluation if she is in that much pain. We cannot do anything for her in an office setting. Pt agrees to this.

## 2014-05-16 NOTE — Telephone Encounter (Signed)
Pt called requesting todays CT result. Pt advised once MD has reviewed CT he or his assistant will call with the result. Pt can be reached at [phone].

## 2014-05-16 NOTE — ED Notes (Signed)
Patient is being transported to xray. Will attempt to obtain urine sample when patient returns.

## 2014-05-16 NOTE — ED Notes (Signed)
Patient was made aware that a urine sample was needed. Patient encouraged to void when able.

## 2014-05-16 NOTE — Discharge Instructions (Signed)

## 2014-05-16 NOTE — ED Provider Notes (Signed)
CSN: [id number]     Arrival date & time 05/16/14  1705 History   First MD Initiated Contact with Patient 05/16/14 1716     Chief Complaint  Patient presents with  . Abdominal Pain  . Hernia     (Consider location/radiation/quality/duration/timing/severity/associated sxs/prior Treatment) Patient is a 43 y.o. female presenting with abdominal pain. The history is provided by the patient.  Abdominal Pain  patient here complaining of worsening periumbilical pain. Had a CAT scan done today which demonstrated 2 small umbilical hernia containing fat. No evidence of obstruction. She notes nonbilious emesis with subjective fever. No change in her stools. Symptoms persistent and are worse with movement and characterized as sharp. She denies any skin rashes Denies any urinary symptoms. No treatment used prior to arrival.  Past Medical History  Diagnosis Date  . Umbilical hernia   . Diabetes mellitus without complication   . Hypertension   . Bipolar disorder   . Scoliosis   . OA (osteoarthritis) of knee   . Abdominal pain   . Generalized headaches   . Leg swelling   . Constipation   . Diarrhea   . Nausea & vomiting   . Weakness   . Easy bruising    Past Surgical History  Procedure Laterality Date  . Hernia mesh removal    . Abdominal hysterectomy    . Knee surgery     No family history on file. History  Substance Use Topics  . Smoking status: Former Games developermoker  . Smokeless tobacco: Not on file  . Alcohol Use: No   OB History   Grav Para Term Preterm Abortions TAB SAB Ect Mult Living                 Review of Systems  Gastrointestinal: Positive for abdominal pain.  All other systems reviewed and are negative.     Allergies  Celebrex; Keflex; Penicillins; and Ultram  Home Medications   Prior to Admission medications   Medication Sig Start Date End Date Taking? Authorizing Provider  hydrOXYzine (ATARAX/VISTARIL) 25 MG tablet Take 25 mg by mouth at bedtime.    Historical  Provider, MD  oxyCODONE-acetaminophen (PERCOCET/ROXICET) 5-325 MG per tablet Take 1 tablet by mouth every 4 (four) hours as needed for severe pain. 05/10/14   Valarie MerinoMatthew B Martin, MD   BP 132/85  Pulse 76  Temp(Src) 99 F (37.2 C) (Oral)  Resp 18  SpO2 100% Physical Exam  Nursing note and vitals reviewed. Constitutional: She is oriented to person, place, and time. She appears well-developed and well-nourished.  Non-toxic appearance. No distress.  HENT:  Head: Normocephalic and atraumatic.  Edentulous  Eyes: Conjunctivae, EOM and lids are normal. Pupils are equal, round, and reactive to light.  Neck: Normal range of motion. Neck supple. No tracheal deviation present. No mass present.  Cardiovascular: Normal rate, regular rhythm and normal heart sounds.  Exam reveals no gallop.   No murmur heard. Pulmonary/Chest: Effort normal and breath sounds normal. No stridor. No respiratory distress. She has no decreased breath sounds. She has no wheezes. She has no rhonchi. She has no rales.  Abdominal: Soft. Normal appearance and bowel sounds are normal. She exhibits no distension. There is tenderness in the periumbilical area. There is no rigidity, no rebound, no guarding and no CVA tenderness.    Small bulging noted at lower umbilicus. Skin is normal. No change in color.  Musculoskeletal: Normal range of motion. She exhibits no edema and no tenderness.  Neurological: She is alert  and oriented to person, place, and time. She has normal strength. No cranial nerve deficit or sensory deficit. GCS eye subscore is 4. GCS verbal subscore is 5. GCS motor subscore is 6.  Skin: Skin is warm and dry. No abrasion and no rash noted.  Psychiatric: Her speech is normal and behavior is normal. Her mood appears anxious.    ED Course  Procedures (including critical care time) Labs Review Labs Reviewed  CBC WITH DIFFERENTIAL  COMPREHENSIVE METABOLIC PANEL  LIPASE, BLOOD  URINALYSIS, ROUTINE W REFLEX MICROSCOPIC      Imaging Review Ct Abdomen Pelvis W Contrast  05/16/2014   CLINICAL DATA:  Umbilical hernia pain and hernia recurrence after mesh removal, nausea, vomiting,  EXAM: CT ABDOMEN AND PELVIS WITH CONTRAST  TECHNIQUE: Multidetector CT imaging of the abdomen and pelvis was performed using the standard protocol following bolus administration of intravenous contrast. Sagittal and coronal MPR images reconstructed from axial data set.  CONTRAST:  OMNIPAQUE IOHEXOL 300 MG/ML SOLN IV, 25mL OMNIPAQUE IOHEXOL 300 MG/ML SOLN ORALLY  COMPARISON:  None  FINDINGS: Lung bases clear.  Liver, spleen, pancreas, kidneys, and adrenal glands normal.  Appendix and uterus surgically absent.  Normal size RIGHT ovary with cyst in LEFT ovary measuring 3.3 x 2.8 cm image 75.  Unremarkable bladder and ureters.  Stomach and bowel loops normal appearance.  Small periumbilical fascial defect identified approximately 9 x 18 mm diameter.  No bowel herniation identified.  Tiny umbilical hernia containing fat, fascial defect 11 x 6 mm.  Remaining abdominal wall fascia appears intact.  Stranding of subcutaneous fat deep to the umbilicus is likely related to prior surgery.  No additional intra-abdominal mass, adenopathy, free fluid, free air, or inflammatory process. Bones unremarkable.  IMPRESSION: Small LEFT ovarian cyst 3.3 x 2.8 cm.  Two small fascial defects at the umbilical region containing fat, with no evidence of bowel herniation.  No other acute intra-abdominal or intrapelvic abnormalities.   Electronically Signed   By: Ulyses Southward M.D.   On: 05/16/2014 10:51     EKG Interpretation None      MDM   Final diagnoses:  None    Patient has no evidence of intestinal obstruction here. She has had no bilious vomiting here. I do not think that she has an incarcerated hernia. She was given pain medication here and repeat abdominal exam at time of discharge remains nonsurgical. She will followup with her surgeon  tomorrow    Toy Baker, MD 05/16/14 [phone]

## 2014-05-16 NOTE — ED Notes (Signed)
Pt states that she was called by her doctor 20 minutes ago to come straight to the ER for emergency surgery.  States she had a CT this morning and was told that she had "two giant umbilical hernias that are strangulating her bowels".  Pt c/o central abd pain.

## 2014-05-16 NOTE — Telephone Encounter (Signed)
Ms. Jennifer Barajas saw Dr. Daphine DeutscherMartin on 05/10/2014 for a recurrent abdominal wall hernia.  She had a CT scan this AM.  [facility] There is nothing on the CT scan that would require an emergency operation.  I recommended she let the ER doctor see her and go from there.  If she is released, she can call our office tomorrow AM and move up her plans with Dr. Daphine DeutscherMartin.  Ovidio Kinavid Tyan Lasure, MD, [facility] Pager: [phone] Office phone:  [phone]

## 2014-05-17 ENCOUNTER — Other Ambulatory Visit (INDEPENDENT_AMBULATORY_CARE_PROVIDER_SITE_OTHER): Payer: Self-pay

## 2014-05-17 ENCOUNTER — Telehealth (INDEPENDENT_AMBULATORY_CARE_PROVIDER_SITE_OTHER): Payer: Self-pay

## 2014-05-17 NOTE — Telephone Encounter (Signed)
Called and spoke to patient regarding her abdominal pain and nausea.  Advised patient that her CT results did not indicate any need for emergency surgery last night.  Patient states she's having some abdominal pain and cannot eat solid foods.  Patient states she can only drink liquids.  Patient advised to go back to the ER for further medical work up.  Patient upset that she continues to receive narcotic pain medications and concerned with addiction.   Advised patient that if she does not want narcotic pain medication then she needs to make the physician aware at the time of her medical assessment.   Patient spouse was yelling in the background which made it difficult to speak with patient.  Again, patient advised if she continues to have nausea or vomiting, severe abdominal pains, fever or chills she needs to seek medical attention at the nearest ER.  Will review with Dr. Daphine DeutscherMartin Monday to see if we can see the patient earlier next week.  Patient aware to call our office if she needs to speak with a surgeon after hours if she has any questions or concerns.  Patient verbalized understanding and agrees with plan as above.

## 2014-05-17 NOTE — Telephone Encounter (Signed)
CT reviewed.  Patient spoke to Dr. Ezzard StandingNewman the evening of 05/16/14.  Patient d/c from ed and to follow up with Dr. Daphine DeutscherMartin.

## 2014-05-17 NOTE — Telephone Encounter (Signed)
Patient states she is having abdomen pain , rates 10. Percocet  Is not helping her . Denies constipation . I advised her I will inform DR. Ezzard StandingNewman , Dr. Daphine DeutscherMartin not available and will call her back with his recommendations

## 2014-05-17 NOTE — Telephone Encounter (Signed)
LMOM. Calling pt to see how she was doing since her ER visit last night.

## 2014-05-21 NOTE — Telephone Encounter (Signed)
Informed pt that Dr Daphine DeutscherMartin has reviewed her CT results and he didn't see anything urgent at this time. He will see pt on her scheduled appt for 05/29/14. Pt verbalized understanding.

## 2014-05-21 NOTE — Telephone Encounter (Signed)
Pt is calling today to see if anyone has spoken with Dr Daphine DeutscherMartin about her concerns. Spoke with Neysa BonitoChristy, she will page Dr Daphine DeutscherMartin and speak with him. Pt states that she has not had a BM in 3 weeks. Pt states that she is still unable to eat much without having n/v and abominal pain. Again, patient advised if she continues to have nausea or vomiting, severe abdominal pains, fever or chills she needs to seek medical attention at the nearest ER. Pt states that she wants to come in to see Dr Daphine DeutscherMartin they have not been able to help her in the ER. Pt states that she wants to have her mesh put back in. Informed pt that we would contact her as soon as we receive a response from Dr Daphine DeutscherMartin. Pt verbalized understanding.

## 2014-05-29 ENCOUNTER — Ambulatory Visit (INDEPENDENT_AMBULATORY_CARE_PROVIDER_SITE_OTHER): Payer: Medicaid Other | Admitting: Surgery

## 2014-05-29 VITALS — BP 126/76 | HR 75 | Temp 97.2°F | Ht 64.0 in | Wt 197.0 lb

## 2014-05-29 DIAGNOSIS — K439 Ventral hernia without obstruction or gangrene: Secondary | ICD-10-CM

## 2014-05-29 NOTE — Progress Notes (Signed)
Chief Complaint:  Recurrent ventral hernia  History of Present Illness:  Jennifer Barajas is an 43 y.o. female seen before with two prior ventral hernia repairs with removal of mesh.  Now she thinks that she wants mesh placed back inside.  CT shows two areas of incarcerated fat in these defects.  Discussed laparoscopic and open repair and she is aware of the risks of complications.    Past Medical History  Diagnosis Date  . Umbilical hernia   . Diabetes mellitus without complication   . Hypertension   . Bipolar disorder   . Scoliosis   . OA (osteoarthritis) of knee   . Abdominal pain   . Generalized headaches   . Leg swelling   . Constipation   . Diarrhea   . Nausea & vomiting   . Weakness   . Easy bruising     Past Surgical History  Procedure Laterality Date  . Hernia mesh removal    . Abdominal hysterectomy    . Knee surgery      Current Outpatient Prescriptions  Medication Sig Dispense Refill  . cholecalciferol (VITAMIN D) 400 UNITS TABS tablet Take 400 Units by mouth 2 (two) times daily.      . hydrOXYzine (ATARAX/VISTARIL) 25 MG tablet Take 25 mg by mouth at bedtime.      Marland Kitchen lisinopril (PRINIVIL,ZESTRIL) 10 MG tablet Take 10 mg by mouth daily.      Marland Kitchen oxyCODONE-acetaminophen (PERCOCET/ROXICET) 5-325 MG per tablet Take 1 tablet by mouth every 4 (four) hours as needed for severe pain.  30 tablet  0  . oxyCODONE-acetaminophen (PERCOCET/ROXICET) 5-325 MG per tablet Take 1-2 tablets by mouth every 4 (four) hours as needed for severe pain.  10 tablet  0   No current facility-administered medications for this visit.   Celebrex; Keflex; Penicillins; and Ultram No family history on file. Social History:   reports that she has quit smoking. She does not have any smokeless tobacco history on file. She reports that she does not drink alcohol or use illicit drugs.   REVIEW OF SYSTEMS :   Physical Exam:   Blood pressure 126/76, pulse 75, temperature 97.2 F (36.2 C), height 5\' 4"   (1.626 m), weight 197 lb (89.359 kg). Body mass index is 33.8 kg/(m^2).  Gen:  WDWN WF NAD  Neurological: Alert and oriented to person, place, and time. Motor and sensory function is grossly intact  Head: Normocephalic and atraumatic.  Eyes: Conjunctivae are normal. Pupils are equal, round, and reactive to light. No scleral icterus.  Neck: Normal range of motion. Neck supple. No tracheal deviation or thyromegaly present.  Cardiovascular:  SR without murmurs or gallops.  No carotid bruits Breast:  Not examined Respiratory: Effort normal.  No respiratory distress. No chest wall tenderness. Breath sounds normal.  No wheezes, rales or rhonchi.  Abdomen:  Fullness around the umbilicus and somewhat tender.  No redness GU:  Not examinined Musculoskeletal: Normal range of motion. Extremities are nontender. No cyanosis, edema or clubbing noted Lymphadenopathy: No cervical, preauricular, postauricular or axillary adenopathy is present Skin: Skin is warm and dry. No rash noted. No diaphoresis. No erythema. No pallor. Pscyh: Normal mood and affect. Behavior is normal. Judgment and thought content normal.   LABORATORY RESULTS: No results found for this or any previous visit (from the past 48 hour(s)).   RADIOLOGY RESULTS: No results found.  Problem List: Patient Active Problem List   Diagnosis Date Noted  . Diabetes 05/10/2014  . recurrent ventral hernia 05/10/2014  Assessment & Plan: Recurrent ventral herniae.  Will schedule for lap/open ventral hernia repair.      Matt B. Daphine DeutscherMartin, MD, [facility]  [facility] [phone] beeper [phone]  05/29/2014 4:55 PM

## 2014-05-29 NOTE — Patient Instructions (Signed)
Laparoscopic Ventral Hernia Repair  Care After  Refer to this sheet in the next few weeks. These instructions provide you with information on caring for yourself after your procedure. Your caregiver may also give you more specific instructions. Your treatment has been planned according to current medical practices, but problems sometimes occur. Call your caregiver if you have any problems or questions after your procedure.   HOME CARE INSTRUCTIONS   · Only take over-the-counter or prescription medicines as directed by your caregiver. If antibiotic medicines are prescribed, take them as directed. Finish them even if you start to feel better.  · Always wash your hands before touching your abdomen.  · Take your bandages (dressings) off after 48 hours or as directed by your caregiver. You may have skin adhesive strips or skin glue over the surgical cuts (incisions). Do not take the strips off or peel the glue off. These will fall off on their own.  · Take showers once your caregiver approves. Until then, only take sponge baths. Do not take tub baths or go swimming until your caregiver approves.  · Check your incision area every day for swelling, redness, warmth, and blood or fluid leaking from the incision. These are signs of infection. Wash your hands before you check.  · Hold a pillow over your abdomen when you cough or sneeze to help ease pain.  · Eat foods that are high in fiber, such as whole grains, fruits, and vegetables. Fiber helps prevent difficult bowel movements (constipation).  · Drink enough fluids to keep your urine clear or pale yellow.  · Rest and lessen activity for 4-5 days after the surgery. You may take short walks if your caregiver approves. Do not drive until approved by your caregiver.  · Do not lift anything heavy, participate in sports, or have sexual intercourse for 6-8 weeks or until your caregiver approves.    · Ask your caregiver when you can return to work.  · Keep all follow-up  appointments.  SEEK MEDICAL CARE IF:   · You have pain even after taking pain medicine.  · You have not had a bowel movement in 3 days.  · You have cramps or are nauseous.  SEEK IMMEDIATE MEDICAL CARE IF:   · You have pain or swelling that is getting worse.  · You have redness around your incisions.  · Your incision is pulling apart.  · You have blood or fluid leaking from your incisions.  · You are vomiting.  · You cannot pass urine.  MAKE SURE YOU:   · Understand these instructions.  · Will watch your condition.  · Will get help right away if you are not doing well or get worse.  Document Released: 10/04/2012 Document Reviewed: 10/04/2012  [employer]® Patient Information ©2015 [employer]. This information is not intended to replace advice given to you by your health care provider. Make sure you discuss any questions you have with your health care provider.

## 2014-06-08 ENCOUNTER — Telehealth (INDEPENDENT_AMBULATORY_CARE_PROVIDER_SITE_OTHER): Payer: Self-pay | Admitting: General Surgery

## 2014-06-08 NOTE — Telephone Encounter (Signed)
Patient called stating she has constipation and a hernia and pain is severe.  Recommended trying a gentle laxative.  If pain worsens or she cannot keep liquids down, she should go to ED.

## 2014-06-12 ENCOUNTER — Other Ambulatory Visit (INDEPENDENT_AMBULATORY_CARE_PROVIDER_SITE_OTHER): Payer: Self-pay | Admitting: Surgery

## 2014-06-12 MED ORDER — OXYCODONE-ACETAMINOPHEN 5-325 MG PO TABS: 1.0000 | ORAL_TABLET | ORAL | Status: DC | PRN

## 2014-06-12 NOTE — Telephone Encounter (Signed)
Patient calling into office to request a refill for Oxycodone has been approved and at the front desk for patient to pick up.  Patient advised if abdominal pain or symptoms increase patient can call our office to speak with a Surgeon after hours or patient may need to go to the ER for further work up.  Patient verbalize understanding

## 2014-06-18 ENCOUNTER — Telehealth (INDEPENDENT_AMBULATORY_CARE_PROVIDER_SITE_OTHER): Payer: Self-pay | Admitting: Surgery

## 2014-06-18 DIAGNOSIS — K469 Unspecified abdominal hernia without obstruction or gangrene: Secondary | ICD-10-CM

## 2014-06-18 NOTE — Telephone Encounter (Signed)
Patient called on call physician with complaints of abdominal pain, constipation, irregular bowel movements, and inability to sleep.  Offered evaluation in ER - patient declined.  Told patient I will forward message to Dr. Daphine DeutscherMartin and ask him or his nurse to contact her in the morning.  She would like to move her surgery up from its present scheduled date of Sept 4th.  I told her I would forward a message to Dr. Daphine DeutscherMartin and his nurse.  If she cannot wait until morning, then she should come to the emergency room to be evaluated by the EDP.  [name], MD, [facility] Office: [phone]

## 2014-06-19 NOTE — Telephone Encounter (Signed)
Pt called to let us know that she called the on-call doc last night and spoke with Dr. Gerrit FriendsGerkin and that he was supposed to get with Dr. Daphine DeutscherMartin.   I advised pt that there was a note in her chart and someone should be calling her this afternoon, that Dr. Daphine DeutscherMartin was not in the office this morning.  Pt verbalized understanding.  Victorino DikeJennifer

## 2014-06-19 NOTE — Telephone Encounter (Signed)
Called pt and made her aware that Dr. Daphine DeutscherMartin did not have an open slot on his OR scheduled to move her surgery up and that if she continues to have worsened symptoms to go to the ED.  Pt verbalized understanding.  Jennifer DikeJennifer

## 2014-06-19 NOTE — Telephone Encounter (Signed)
Reviewed patient phone encounter with Dr. Daphine DeutscherMartin.  At this time, there's no available time on the OR schedule to move patient surgery up from Sept 4th.  Patient needs to be encouraged to go the the ED for further evaluation if her symptoms persist or worsen.

## 2014-06-21 ENCOUNTER — Encounter (HOSPITAL_COMMUNITY): Payer: Self-pay | Admitting: Pharmacy Technician

## 2014-06-28 ENCOUNTER — Ambulatory Visit (HOSPITAL_COMMUNITY)
Admission: RE | Admit: 2014-06-28 | Discharge: 2014-06-28 | Disposition: A | Discharge status: Home / Self Care | Payer: Medicaid Other | Source: Ambulatory Visit | Attending: Anesthesiology | Admitting: Anesthesiology

## 2014-06-28 ENCOUNTER — Encounter (HOSPITAL_COMMUNITY)
Admission: RE | Admit: 2014-06-28 | Discharge: 2014-06-28 | Disposition: A | Discharge status: Home / Self Care | Payer: Medicaid Other | Source: Ambulatory Visit | Attending: Surgery | Admitting: Surgery

## 2014-06-28 ENCOUNTER — Encounter (HOSPITAL_COMMUNITY): Payer: Self-pay

## 2014-06-28 DIAGNOSIS — Z01818 Encounter for other preprocedural examination: Secondary | ICD-10-CM | POA: Diagnosis present

## 2014-06-28 HISTORY — DX: Type 2 diabetes mellitus without complications: E11.9

## 2014-06-28 HISTORY — DX: Anemia, unspecified: D64.9

## 2014-06-28 HISTORY — DX: Gastro-esophageal reflux disease without esophagitis: K21.9

## 2014-06-28 HISTORY — DX: Sleep disorder, unspecified: G47.9

## 2014-06-28 LAB — BASIC METABOLIC PANEL
Anion gap: 12 (ref 5–15)
BUN: 7 mg/dL (ref 6–23)
CO2: 24 mEq/L (ref 19–32)
CREATININE: 0.72 mg/dL (ref 0.50–1.10)
Calcium: 9.4 mg/dL (ref 8.4–10.5)
Chloride: 101 mEq/L (ref 96–112)
GFR calc non Af Amer: >90 mL/min (ref >90)
Glucose, Bld: 102 mg/dL — ABNORMAL HIGH (ref 70–99)
Potassium: 4.3 mEq/L (ref 3.7–5.3)
Sodium: 137 mEq/L (ref 137–147)

## 2014-06-28 LAB — CBC
HCT: 37.7 % (ref 36.0–46.0)
Hemoglobin: 12.8 g/dL (ref 12.0–15.0)
MCH: 28.7 pg (ref 26.0–34.0)
MCHC: 34 g/dL (ref 30.0–36.0)
MCV: 84.5 fL (ref 78.0–100.0)
Platelets: 201 10*3/uL (ref 150–400)
RBC: 4.46 MIL/uL (ref 3.87–5.11)
RDW: 13.3 % (ref 11.5–15.5)
WBC: 7.4 10*3/uL (ref 4.0–10.5)

## 2014-06-28 LAB — PROTIME-INR
INR: 1.05 (ref 0.00–1.49)
Prothrombin Time: 13.7 seconds (ref 11.6–15.2)

## 2014-06-28 LAB — APTT: APTT: 25 s (ref 24–37)

## 2014-06-28 NOTE — Patient Instructions (Addendum)
Jennifer Barajas  06/28/2014                           YOUR PROCEDURE IS SCHEDULED ON: 07/05/14               ENTER THRU [facility] ENTRANCE AND                            FOLLOW  SIGNS TO SHORT STAY CENTER                 ARRIVE AT SHORT STAY AT: 5:15 AM               CALL THIS NUMBER IF ANY PROBLEMS THE DAY OF SURGERY :               [phone]                                REMEMBER:   Do not eat food or drink liquids AFTER MIDNIGHT                  Take these medicines the morning of surgery with               A SIPS OF WATER : PRILOSEC / PERCOCET               STOP ALL ASPIRIN AND HERBAL MEDS 5 DAYS PREOP              USE FLEET ENEMA THE NIGHT BEFORE SURGERY AND THE MORNING OF SURGERY    Do not wear jewelry, make-up   Do not wear lotions, powders, or perfumes.   Do not shave legs or underarms 12 hrs. before surgery (men may shave face)  Do not bring valuables to the hospital.  Contacts, dentures or bridgework may not be worn into surgery.  Leave suitcase in the car. After surgery it may be brought to your room.  For patients admitted to the hospital more than one night, checkout time is            11:00 AM                                                       The day of discharge.   Patients discharged the day of surgery will not be allowed to drive home.            If going home same day of surgery, must have someone stay with you              FIRST 24 hrs at home and arrange for some one to drive you              home from hospital.   ________________________________________________________________________  [place]  Before surgery, you can play an important role.  Because skin is not sterile, your skin needs to be as free of germs as possible.  You can reduce the number of germs on your skin by washing with CHG (chlorahexidine gluconate)  soap before surgery.  CHG is an antiseptic cleaner which kills germs and bonds with the skin to continue killing germs even after washing. Please DO NOT use if you have an allergy to CHG or antibacterial soaps.  If your skin becomes reddened/irritated stop using the CHG and inform your nurse when you arrive at Short Stay. Do not shave (including legs and underarms) for at least 48 hours prior to the first CHG shower.  You may shave your face. Please follow these instructions carefully:   1.  Shower with CHG Soap the night before surgery and the  morning of Surgery.   2.  If you choose to wash your hair, wash your hair first as usual with your  normal  Shampoo.   3.  After you shampoo, rinse your hair and body thoroughly to remove the  shampoo.                                         4.  Use CHG as you would any other liquid soap.  You can apply chg directly  to the skin and wash . Gently wash with scrungie or clean wascloth    5.  Apply the CHG Soap to your body ONLY FROM THE NECK DOWN.   Do not use on open                           Wound or open sores. Avoid contact with eyes, ears mouth and genitals (private parts).                        Genitals (private parts) with your normal soap.              6.  Wash thoroughly, paying special attention to the area where your surgery  will be performed.   7.  Thoroughly rinse your body with warm water from the neck down.   8.  DO NOT shower/wash with your normal soap after using and rinsing off  the CHG Soap .                9.  Pat yourself dry with a clean towel.             10.  Wear clean pajamas.             11.  Place clean sheets on your bed the night of your first shower and do not  sleep with pets.  Day of Surgery : Do not apply any lotions/deodorants the morning of surgery.  Please wear clean clothes to the hospital/surgery center.  FAILURE TO FOLLOW THESE INSTRUCTIONS MAY RESULT IN THE CANCELLATION OF YOUR SURGERY    PATIENT  SIGNATURE_________________________________  ______________________________________________________________________

## 2014-07-05 ENCOUNTER — Encounter (HOSPITAL_COMMUNITY): Payer: Self-pay | Admitting: *Deleted

## 2014-07-05 ENCOUNTER — Encounter (HOSPITAL_COMMUNITY)
Admission: RE | Disposition: A | Discharge status: Home / Self Care | Payer: Self-pay | Source: Ambulatory Visit | Attending: Surgery

## 2014-07-05 ENCOUNTER — Ambulatory Visit (HOSPITAL_COMMUNITY): Payer: Medicaid Other | Admitting: Anesthesiology

## 2014-07-05 ENCOUNTER — Encounter (HOSPITAL_COMMUNITY): Payer: Medicaid Other | Admitting: Anesthesiology

## 2014-07-05 ENCOUNTER — Other Ambulatory Visit (INDEPENDENT_AMBULATORY_CARE_PROVIDER_SITE_OTHER): Payer: Self-pay | Admitting: General Surgery

## 2014-07-05 ENCOUNTER — Ambulatory Visit (HOSPITAL_COMMUNITY)
Admission: RE | Admit: 2014-07-05 | Discharge: 2014-07-05 | Disposition: A | Discharge status: Home / Self Care | Payer: Medicaid Other | Source: Ambulatory Visit | Attending: Surgery | Admitting: Surgery

## 2014-07-05 ENCOUNTER — Telehealth (INDEPENDENT_AMBULATORY_CARE_PROVIDER_SITE_OTHER): Payer: Self-pay | Admitting: Surgery

## 2014-07-05 DIAGNOSIS — M4120 Other idiopathic scoliosis, site unspecified: Secondary | ICD-10-CM | POA: Insufficient documentation

## 2014-07-05 DIAGNOSIS — Z87891 Personal history of nicotine dependence: Secondary | ICD-10-CM | POA: Diagnosis not present

## 2014-07-05 DIAGNOSIS — K429 Umbilical hernia without obstruction or gangrene: Secondary | ICD-10-CM | POA: Diagnosis present

## 2014-07-05 DIAGNOSIS — F319 Bipolar disorder, unspecified: Secondary | ICD-10-CM | POA: Diagnosis not present

## 2014-07-05 DIAGNOSIS — M1710 Unilateral primary osteoarthritis, unspecified knee: Secondary | ICD-10-CM | POA: Insufficient documentation

## 2014-07-05 DIAGNOSIS — E119 Type 2 diabetes mellitus without complications: Secondary | ICD-10-CM | POA: Diagnosis not present

## 2014-07-05 DIAGNOSIS — I10 Essential (primary) hypertension: Secondary | ICD-10-CM | POA: Diagnosis not present

## 2014-07-05 HISTORY — PX: INSERTION OF MESH: SHX5868

## 2014-07-05 HISTORY — PX: VENTRAL HERNIA REPAIR: SHX424

## 2014-07-05 LAB — GLUCOSE, CAPILLARY: Glucose-Capillary: 96 mg/dL (ref 70–99)

## 2014-07-05 SURGERY — REPAIR, HERNIA, VENTRAL, LAPAROSCOPIC
Anesthesia: General | Site: Abdomen

## 2014-07-05 MED ORDER — CHLORHEXIDINE GLUCONATE 4 % EX LIQD: 1.0000 "application " | Freq: Once | CUTANEOUS | Status: DC

## 2014-07-05 MED ORDER — BUPIVACAINE LIPOSOME 1.3 % IJ SUSP
20.0000 mL | Freq: Once | INTRAMUSCULAR | Status: AC
  Administered 2014-07-05: 20 mL
  Filled 2014-07-05: qty 20

## 2014-07-05 MED ORDER — FENTANYL CITRATE 0.05 MG/ML IJ SOLN
INTRAMUSCULAR | Status: DC
Start: 2014-07-05 — End: 2014-07-05
  Filled 2014-07-05: qty 2

## 2014-07-05 MED ORDER — SODIUM CHLORIDE 0.9 % IJ SOLN: 3.0000 mL | Freq: Two times a day (BID) | INTRAMUSCULAR | Status: DC

## 2014-07-05 MED ORDER — OXYCODONE HCL 5 MG PO TABS: 5.0000 mg | ORAL_TABLET | ORAL | Status: DC | PRN

## 2014-07-05 MED ORDER — BUPIVACAINE-EPINEPHRINE 0.25% -1:200000 IJ SOLN
INTRAMUSCULAR | Status: AC
  Filled 2014-07-05: qty 1

## 2014-07-05 MED ORDER — DEXAMETHASONE SODIUM PHOSPHATE 10 MG/ML IJ SOLN
INTRAMUSCULAR | Status: AC
  Filled 2014-07-05: qty 1

## 2014-07-05 MED ORDER — SODIUM CHLORIDE 0.9 % IJ SOLN
INTRAMUSCULAR | Status: AC
  Filled 2014-07-05: qty 10

## 2014-07-05 MED ORDER — VANCOMYCIN HCL IN DEXTROSE 1-5 GM/200ML-% IV SOLN
INTRAVENOUS | Status: AC
  Filled 2014-07-05: qty 200

## 2014-07-05 MED ORDER — HEPARIN SODIUM (PORCINE) 5000 UNIT/ML IJ SOLN
5000.0000 [IU] | Freq: Once | INTRAMUSCULAR | Status: AC
  Administered 2014-07-05: 5000 [IU] via SUBCUTANEOUS
  Filled 2014-07-05: qty 1

## 2014-07-05 MED ORDER — VANCOMYCIN HCL IN DEXTROSE 1-5 GM/200ML-% IV SOLN
1000.0000 mg | INTRAVENOUS | Status: AC
  Administered 2014-07-05: 1000 mg via INTRAVENOUS

## 2014-07-05 MED ORDER — ONDANSETRON HCL 4 MG/2ML IJ SOLN
INTRAMUSCULAR | Status: AC
  Filled 2014-07-05: qty 2

## 2014-07-05 MED ORDER — GLYCOPYRROLATE 0.2 MG/ML IJ SOLN
INTRAMUSCULAR | Status: AC
  Filled 2014-07-05: qty 3

## 2014-07-05 MED ORDER — MIDAZOLAM HCL 5 MG/5ML IJ SOLN
INTRAMUSCULAR | Status: DC | PRN
  Administered 2014-07-05: 2 mg via INTRAVENOUS

## 2014-07-05 MED ORDER — 0.9 % SODIUM CHLORIDE (POUR BTL) OPTIME
TOPICAL | Status: DC | PRN
  Administered 2014-07-05: 1000 mL

## 2014-07-05 MED ORDER — LACTATED RINGERS IV SOLN
INTRAVENOUS | Status: DC
Start: 2014-07-05 — End: 2014-07-05

## 2014-07-05 MED ORDER — PROMETHAZINE HCL 25 MG/ML IJ SOLN
6.2500 mg | INTRAMUSCULAR | Status: DC | PRN
  Administered 2014-07-05: 6.25 mg via INTRAVENOUS

## 2014-07-05 MED ORDER — KETOROLAC TROMETHAMINE 15 MG/ML IJ SOLN
INTRAMUSCULAR | Status: DC
Start: 2014-07-05 — End: 2014-07-05
  Filled 2014-07-05: qty 1

## 2014-07-05 MED ORDER — PROMETHAZINE HCL 25 MG/ML IJ SOLN
INTRAMUSCULAR | Status: DC
Start: 2014-07-05 — End: 2014-07-05
  Filled 2014-07-05: qty 1

## 2014-07-05 MED ORDER — DEXAMETHASONE SODIUM PHOSPHATE 10 MG/ML IJ SOLN
INTRAMUSCULAR | Status: DC | PRN
  Administered 2014-07-05: 10 mg via INTRAVENOUS

## 2014-07-05 MED ORDER — FLEET ENEMA 7-19 GM/118ML RE ENEM: 1.0000 | ENEMA | Freq: Once | RECTAL | Status: DC

## 2014-07-05 MED ORDER — ROCURONIUM BROMIDE 100 MG/10ML IV SOLN
INTRAVENOUS | Status: DC | PRN
  Administered 2014-07-05: 10 mg via INTRAVENOUS
  Administered 2014-07-05: 50 mg via INTRAVENOUS
  Administered 2014-07-05: 5 mg via INTRAVENOUS

## 2014-07-05 MED ORDER — LACTATED RINGERS IV SOLN
INTRAVENOUS | Status: DC | PRN
  Administered 2014-07-05 (×2): via INTRAVENOUS

## 2014-07-05 MED ORDER — EPHEDRINE SULFATE 50 MG/ML IJ SOLN
INTRAMUSCULAR | Status: AC
  Filled 2014-07-05: qty 1

## 2014-07-05 MED ORDER — NEOSTIGMINE METHYLSULFATE 10 MG/10ML IV SOLN
INTRAVENOUS | Status: DC | PRN
  Administered 2014-07-05: 4 mg via INTRAVENOUS

## 2014-07-05 MED ORDER — ACETAMINOPHEN 325 MG PO TABS: 650.0000 mg | ORAL_TABLET | ORAL | Status: DC | PRN

## 2014-07-05 MED ORDER — SODIUM CHLORIDE 0.9 % IJ SOLN: 3.0000 mL | INTRAMUSCULAR | Status: DC | PRN

## 2014-07-05 MED ORDER — FENTANYL CITRATE 0.05 MG/ML IJ SOLN
INTRAMUSCULAR | Status: AC
  Filled 2014-07-05: qty 2

## 2014-07-05 MED ORDER — LIDOCAINE HCL (CARDIAC) 20 MG/ML IV SOLN
INTRAVENOUS | Status: DC | PRN
  Administered 2014-07-05: 60 mg via INTRAVENOUS

## 2014-07-05 MED ORDER — MEPERIDINE HCL 50 MG/ML IJ SOLN: 6.2500 mg | INTRAMUSCULAR | Status: DC | PRN

## 2014-07-05 MED ORDER — NEOSTIGMINE METHYLSULFATE 10 MG/10ML IV SOLN
INTRAVENOUS | Status: AC
  Filled 2014-07-05: qty 1

## 2014-07-05 MED ORDER — HYDROMORPHONE HCL PF 2 MG/ML IJ SOLN
INTRAMUSCULAR | Status: AC
  Filled 2014-07-05: qty 1

## 2014-07-05 MED ORDER — HYDROMORPHONE HCL PF 1 MG/ML IJ SOLN
INTRAMUSCULAR | Status: DC | PRN
  Administered 2014-07-05: 0.5 mg via INTRAVENOUS
  Administered 2014-07-05: 1 mg via INTRAVENOUS
  Administered 2014-07-05: 0.5 mg via INTRAVENOUS

## 2014-07-05 MED ORDER — ONDANSETRON HCL 4 MG/2ML IJ SOLN
INTRAMUSCULAR | Status: DC | PRN
  Administered 2014-07-05: 4 mg via INTRAVENOUS

## 2014-07-05 MED ORDER — MIDAZOLAM HCL 2 MG/2ML IJ SOLN
INTRAMUSCULAR | Status: AC
  Filled 2014-07-05: qty 2

## 2014-07-05 MED ORDER — ROCURONIUM BROMIDE 100 MG/10ML IV SOLN
INTRAVENOUS | Status: AC
  Filled 2014-07-05: qty 1

## 2014-07-05 MED ORDER — PROPOFOL 10 MG/ML IV BOLUS
INTRAVENOUS | Status: DC | PRN
  Administered 2014-07-05: 150 mg via INTRAVENOUS

## 2014-07-05 MED ORDER — KETOROLAC TROMETHAMINE 15 MG/ML IJ SOLN
15.0000 mg | Freq: Once | INTRAMUSCULAR | Status: AC
  Administered 2014-07-05: 15 mg via INTRAVENOUS

## 2014-07-05 MED ORDER — OXYCODONE HCL 5 MG PO TABS
5.0000 mg | ORAL_TABLET | ORAL | Status: DC | PRN
  Administered 2014-07-05: 10 mg via ORAL
  Filled 2014-07-05: qty 2

## 2014-07-05 MED ORDER — SODIUM CHLORIDE 0.9 % IV SOLN: 250.0000 mL | INTRAVENOUS | Status: DC | PRN

## 2014-07-05 MED ORDER — FENTANYL CITRATE 0.05 MG/ML IJ SOLN
INTRAMUSCULAR | Status: DC | PRN
  Administered 2014-07-05 (×3): 50 ug via INTRAVENOUS
  Administered 2014-07-05: 100 ug via INTRAVENOUS

## 2014-07-05 MED ORDER — LACTATED RINGERS IR SOLN
Status: DC | PRN
  Administered 2014-07-05: 1000 mL

## 2014-07-05 MED ORDER — OXYCODONE-ACETAMINOPHEN 7.5-325 MG PO TABS: 1.0000 | ORAL_TABLET | ORAL | Status: DC | PRN

## 2014-07-05 MED ORDER — FENTANYL CITRATE 0.05 MG/ML IJ SOLN
INTRAMUSCULAR | Status: AC
  Filled 2014-07-05: qty 5

## 2014-07-05 MED ORDER — LIDOCAINE HCL (CARDIAC) 20 MG/ML IV SOLN
INTRAVENOUS | Status: AC
  Filled 2014-07-05: qty 5

## 2014-07-05 MED ORDER — GLYCOPYRROLATE 0.2 MG/ML IJ SOLN
INTRAMUSCULAR | Status: DC | PRN
  Administered 2014-07-05: 0.6 mg via INTRAVENOUS

## 2014-07-05 MED ORDER — FENTANYL CITRATE 0.05 MG/ML IJ SOLN
25.0000 ug | INTRAMUSCULAR | Status: DC | PRN
  Administered 2014-07-05 (×3): 50 ug via INTRAVENOUS

## 2014-07-05 MED ORDER — ACETAMINOPHEN 650 MG RE SUPP
650.0000 mg | RECTAL | Status: DC | PRN
  Filled 2014-07-05: qty 1

## 2014-07-05 MED ORDER — PROPOFOL 10 MG/ML IV BOLUS
INTRAVENOUS | Status: AC
  Filled 2014-07-05: qty 20

## 2014-07-05 SURGICAL SUPPLY — 47 items
BENZOIN TINCTURE PRP APPL 2/3 (GAUZE/BANDAGES/DRESSINGS) IMPLANT
BINDER ABDOMINAL 12 ML 46-62 (SOFTGOODS) ×4 IMPLANT
CLOSURE WOUND 1/2 X4 (GAUZE/BANDAGES/DRESSINGS)
DECANTER SPIKE VIAL GLASS SM (MISCELLANEOUS) IMPLANT
DERMABOND ADVANCED (GAUZE/BANDAGES/DRESSINGS) ×2
DERMABOND ADVANCED .7 DNX12 (GAUZE/BANDAGES/DRESSINGS) ×2 IMPLANT
DEVICE RELIATACK FIXATION (MISCELLANEOUS) ×4 IMPLANT
DEVICE SECURE STRAP 25 ABSORB (INSTRUMENTS) IMPLANT
DEVICE TROCAR PUNCTURE CLOSURE (ENDOMECHANICALS) ×4 IMPLANT
DISSECTOR BLUNT TIP ENDO 5MM (MISCELLANEOUS) IMPLANT
DRAIN CHANNEL 19F RND (DRAIN) IMPLANT
DRAPE LAPAROSCOPIC ABDOMINAL (DRAPES) ×4 IMPLANT
ELECT CAUTERY BLADE 6.4 (BLADE) IMPLANT
ELECT REM PT RETURN 9FT ADLT (ELECTROSURGICAL) ×4
ELECTRODE REM PT RTRN 9FT ADLT (ELECTROSURGICAL) ×2 IMPLANT
EVACUATOR SILICONE 100CC (DRAIN) IMPLANT
GLOVE BIOGEL M 8.0 STRL (GLOVE) ×4 IMPLANT
GOWN SPEC L4 XLG W/TWL (GOWN DISPOSABLE) IMPLANT
GOWN STRL REUS W/TWL XL LVL3 (GOWN DISPOSABLE) ×12 IMPLANT
HOLDER FOLEY CATH W/STRAP (MISCELLANEOUS) ×4 IMPLANT
KIT BASIN OR (CUSTOM PROCEDURE TRAY) ×4 IMPLANT
MESH PARIETEX 4.7 (Mesh General) ×4 IMPLANT
NEEDLE SPNL 22GX3.5 QUINCKE BK (NEEDLE) ×4 IMPLANT
PEN SKIN MARKING BROAD (MISCELLANEOUS) ×4 IMPLANT
PENCIL BUTTON HOLSTER BLD 10FT (ELECTRODE) ×4 IMPLANT
RELOAD RELIATACK 5 (MISCELLANEOUS) ×8 IMPLANT
SCRUB PCMX 4 OZ (MISCELLANEOUS) ×4 IMPLANT
SET IRRIG TUBING LAPAROSCOPIC (IRRIGATION / IRRIGATOR) ×4 IMPLANT
SHEARS CURVED HARMONIC AC 45CM (MISCELLANEOUS) ×4 IMPLANT
SLEEVE XCEL OPT CAN 5 100 (ENDOMECHANICALS) IMPLANT
SOLUTION ANTI FOG 6CC (MISCELLANEOUS) ×4 IMPLANT
STAPLER VISISTAT 35W (STAPLE) ×4 IMPLANT
STRIP CLOSURE SKIN 1/2X4 (GAUZE/BANDAGES/DRESSINGS) IMPLANT
SUT NOVA 0 T19/GS 22DT (SUTURE) IMPLANT
SUT NOVA 1 T20/GS 25DT (SUTURE) IMPLANT
SUT NOVA NAB DX-16 0-1 5-0 T12 (SUTURE) ×4 IMPLANT
SUT PROLENE 0 CT 1 CR/8 (SUTURE) IMPLANT
SUT VIC AB 4-0 SH 18 (SUTURE) ×4 IMPLANT
SYR 20CC LL (SYRINGE) IMPLANT
TACKER 5MM HERNIA 3.5CML NAB (ENDOMECHANICALS) IMPLANT
TRAY FOLEY CATH 14FRSI W/METER (CATHETERS) ×4 IMPLANT
TRAY LAP CHOLE (CUSTOM PROCEDURE TRAY) ×4 IMPLANT
TROCAR ADV FIXATION 11X100MM (TROCAR) ×4 IMPLANT
TROCAR ADV FIXATION 5X100MM (TROCAR) ×4 IMPLANT
TROCAR BLADELESS OPT 5 100 (ENDOMECHANICALS) ×4 IMPLANT
TROCAR XCEL NON-BLD 11X100MML (ENDOMECHANICALS) IMPLANT
TUBING INSUFFLATION 10FT LAP (TUBING) ×4 IMPLANT

## 2014-07-05 NOTE — Brief Op Note (Signed)
07/05/2014  11:53 AM  PATIENT:  Jennifer Barajas  43 y.o. female  PRE-OPERATIVE DIAGNOSIS:  ventral hernia  POST-OPERATIVE DIAGNOSIS:  ventral hernia  PROCEDURE:  Procedure(s): LAPRASCOPIC VENTRAL HERNIA REPAIR WITH MESH (N/A) INSERTION OF MESH  SURGEON:  Surgeon(s) and Role:    * Wenda Low, MD - Primary  PHYSICIAN ASSISTANT:   ASSISTANTS: none   ANESTHESIA:   general  EBL:  Total I/O In: 1800 [I.V.:1800] Out: 160 [Urine:150; Blood:10]  BLOOD ADMINISTERED:none  DRAINS: none   LOCAL MEDICATIONS USED:  BUPIVICAINE   SPECIMEN:  No Specimen  DISPOSITION OF SPECIMEN:  N/A  COUNTS:  YES  TOURNIQUET:  * No tourniquets in log *  DICTATION: .Other Dictation: Dictation Number [id number]  PLAN OF CARE: Discharge to home after PACU  PATIENT DISPOSITION:  PACU - hemodynamically stable.   Delay start of Pharmacological VTE agent (>24hrs) due to surgical blood loss or risk of bleeding: not applicable

## 2014-07-05 NOTE — Interval H&P Note (Signed)
History and Physical Interval Note:  07/05/2014 7:24 AM  Jennifer Barajas  has presented today for surgery, with the diagnosis of ventral hernia  The various methods of treatment have been discussed with the patient and family. After consideration of risks, benefits and other options for treatment, the patient has consented to  Procedure(s): LAPRASCOPIC POSSIBLE OPEN VENTRAL HERNIA REPAIR (N/A) as a surgical intervention .  The patient's history has been reviewed, patient examined, no change in status, stable for surgery.  I have reviewed the patient's chart and labs.  Questions were answered to the patient's satisfaction.     Harshan Kearley B

## 2014-07-05 NOTE — Telephone Encounter (Signed)
She had a laparoscopic repair of a ventral hernia today by Dr. Daphine Deutscher. She stated she lost her pain medication which is oxycodone. I told her I would leave a prescription for this at the front desk otherwise along emergency department.

## 2014-07-05 NOTE — Anesthesia Postprocedure Evaluation (Signed)
  Anesthesia Post-op Note  Patient: Stage manager  Procedure(s) Performed: Procedure(s) (LRB): LAPRASCOPIC VENTRAL HERNIA REPAIR WITH MESH (N/A) INSERTION OF MESH  Patient Location: PACU  Anesthesia Type: General  Level of Consciousness: awake and alert   Airway and Oxygen Therapy: Patient Spontanous Breathing  Post-op Pain: mild  Post-op Assessment: Post-op Vital signs reviewed, Patient's Cardiovascular Status Stable, Respiratory Function Stable, Patent Airway and No signs of Nausea or vomiting  Last Vitals:  Filed Vitals:   07/05/14 1337  BP: 111/72  Pulse: 64  Temp: 36.7 C  Resp: 16    Post-op Vital Signs: stable   Complications: No apparent anesthesia complications

## 2014-07-05 NOTE — Transfer of Care (Signed)
Immediate Anesthesia Transfer of Care Note  Patient: Jennifer Barajas  Procedure(s) Performed: Procedure(s): LAPRASCOPIC VENTRAL HERNIA REPAIR WITH MESH (N/A) INSERTION OF MESH  Patient Location: PACU  Anesthesia Type:General  Level of Consciousness: awake, alert  and oriented  Airway & Oxygen Therapy: Patient Spontanous Breathing and Patient connected to face mask oxygen  Post-op Assessment: Report given to PACU RN and Post -op Vital signs reviewed and stable  Post vital signs: Reviewed and stable  Complications: No apparent anesthesia complications

## 2014-07-05 NOTE — Discharge Instructions (Signed)
Laparoscopic Ventral Hernia Repair Laparoscopic ventral hernia repairis a surgery to fix a ventral hernia. Aventral hernia, also called an incisional hernia, is a bulge of body tissue or intestines that pushes through the front part of the abdomen. This can happen if the connective tissue covering the muscles over the abdomen has a weak spot or is torn because of a surgical cut (incision) from a previous surgery. Laparoscopic ventral hernia repair is often done soon after diagnosis to stop the hernia from getting bigger, becoming uncomfortable, or becoming an emergency. This surgery usually takes about 2 hours, but the time can vary greatly. LET [facility] CARE PROVIDER KNOW ABOUT:  Any allergies you have.  All medicines you are taking, including steroids, vitamins, herbs, eye drops, creams, and over-the-counter medicines.  Previous problems you or members of your family have had with the use of anesthetics.  Any blood disorders you have.  Previous surgeries you have had.  Medical conditions you have. RISKS AND COMPLICATIONS  Generally, laparoscopic ventral hernia repair is a safe procedure. However, as with any surgical procedure, problems can occur. Possible problems include:  Bleeding.  Trouble passing urine or having a bowel movement after the surgery.  Infection.  Pneumonia.  Blood clots.  Pain in the area of the hernia.  A bulge in the area of the hernia that may be caused by a collection of fluid.  Injury to intestines or other structures in the abdomen.  Return of the hernia after surgery. In some cases, your health care provider may need to stop the laparoscopic procedure and do regular, open surgery. This may be necessary for very difficult hernias, when organs are hard to see, or when bleeding problems occur during surgery. BEFORE THE PROCEDURE   You may need to have blood tests, urine tests, a chest X-ray, or an electrocardiogram done before the day of the  surgery.  Ask your health care provider about changing or stopping your regular medicines. This is especially important if you are taking diabetes medicines or blood thinners.  You may need to wash with a special type of germ-killing soap.  Do not eat or drink anything after midnight the night before the procedure or as directed by your health care provider.  Make plans to have someone drive you home after the procedure. PROCEDURE   Small monitors will be put on your body. They are used to check your heart, blood pressure, and oxygen level.  An IV access tube will be put into a vein in your hand or arm. Fluids and medicine will flow directly into your body through the IV tube.  You will be given medicine that makes you go to sleep (general anesthetic).  Your abdomen will be cleaned with a special soap to kill any germs on your skin.  Once you are asleep, several small incisions will be made in your abdomen.  The large space in your abdomen will be filled with air so that it expands. This gives your health care provider more room and a better view.  A thin, lighted tube with a tiny camera on the end (laparoscope) is put through a small incision in your abdomen. The camera on the laparoscope sends a picture to a TV screen in the operating room. This gives your health care provider a good view inside your abdomen.  Hollow tubes are put through the other small incisions in your abdomen. The tools needed for the procedure are put through these tubes.  Your health care provider  puts the tissue or intestines that formed the hernia back in place.  A screen-like patch (mesh) is used to close the hernia. This helps make the area stronger. Stitches, tacks, or staples are used to keep the mesh in place.  Medicine and a bandage (dressing) or skin glue will be put over the incisions. AFTER THE PROCEDURE   You will stay in a recovery area until the anesthetic wears off. Your blood pressure and  pulse will be checked often.  You may be able to go home the same day or may need to stay in the hospital for 1-2 days after surgery. Your health care provider will decide when you can go home.  You may feel some pain. You may be given medicine for pain.  You will be urged to do breathing exercises that involve taking deep breaths. This helps prevent a lung infection after a surgery.  You may have to wear compression stockings while you are in the hospital. These stockings help keep blood clots from forming in your legs. Document Released: 10/04/2012 Document Revised: 10/23/2013 Document Reviewed: 10/04/2012 [facility] Patient Information 2015 [place], [place]. This information is not intended to replace advice given to you by your health care provider. Make sure you discuss any questions you have with your health care provider.      General Anesthesia, Care After Refer to this sheet in the next few weeks. These instructions provide you with information on caring for yourself after your procedure. Your health care provider may also give you more specific instructions. Your treatment has been planned according to current medical practices, but problems sometimes occur. Call your health care provider if you have any problems or questions after your procedure. WHAT TO EXPECT AFTER THE PROCEDURE After the procedure, it is typical to experience:  Sleepiness.  Nausea and vomiting. HOME CARE INSTRUCTIONS  For the first 24 hours after general anesthesia:  Have a responsible person with you.  Do not drive a car. If you are alone, do not take public transportation.  Do not drink alcohol.  Do not take medicine that has not been prescribed by your health care provider.  Do not sign important papers or make important decisions.  You may resume a normal diet and activities as directed by your health care provider.  Change bandages (dressings) as directed.  If you have questions or problems  that seem related to general anesthesia, call the hospital and ask for the anesthetist or anesthesiologist on call. SEEK MEDICAL CARE IF:  You have nausea and vomiting that continue the day after anesthesia.  You develop a rash. SEEK IMMEDIATE MEDICAL CARE IF:   You have difficulty breathing.  You have chest pain.  You have any allergic problems. Document Released: 01/24/2001 Document Revised: 10/23/2013 Document Reviewed: 05/03/2013 [facility] Patient Information 2015 [place], [place]. This information is not intended to replace advice given to you by your health care provider. Make sure you discuss any questions you have with your health care provider.

## 2014-07-05 NOTE — Anesthesia Preprocedure Evaluation (Addendum)
Anesthesia Evaluation  Patient identified by MRN, date of birth, ID band Patient awake    Reviewed: Allergy & Precautions, H&P , NPO status , Patient's Chart, lab work & pertinent test results  Airway Mallampati: II TM Distance: >3 FB Neck ROM: Full    Dental no notable dental hx. (+) Edentulous Upper, Edentulous Lower   Pulmonary neg pulmonary ROS, former smoker,  breath sounds clear to auscultation  Pulmonary exam normal       Cardiovascular hypertension, Pt. on medications negative cardio ROS  Rhythm:Regular Rate:Normal     Neuro/Psych Bipolar Disorder negative neurological ROS  negative psych ROS   GI/Hepatic negative GI ROS, Neg liver ROS,   Endo/Other  negative endocrine ROSdiabetes  Renal/GU negative Renal ROS  negative genitourinary   Musculoskeletal negative musculoskeletal ROS (+)   Abdominal   Peds negative pediatric ROS (+)  Hematology negative hematology ROS (+)   Anesthesia Other Findings   Reproductive/Obstetrics negative OB ROS                        Anesthesia Physical Anesthesia Plan  ASA: II  Anesthesia Plan: General   Post-op Pain Management:    Induction: Intravenous  Airway Management Planned: Oral ETT  Additional Equipment:   Intra-op Plan:   Post-operative Plan: Extubation in OR  Informed Consent: I have reviewed the patients History and Physical, chart, labs and discussed the procedure including the risks, benefits and alternatives for the proposed anesthesia with the patient or authorized representative who has indicated his/her understanding and acceptance.   Dental advisory given  Plan Discussed with: CRNA  Anesthesia Plan Comments:         Anesthesia Quick Evaluation

## 2014-07-05 NOTE — H&P (Signed)
Chief Complaint: Recurrent ventral hernia  History of Present Illness: Jennifer Barajas is an 43 y.o. female seen before with two prior ventral hernia repairs with removal of mesh. Now she thinks that she wants mesh placed back inside. CT shows two areas of incarcerated fat in these defects. Discussed laparoscopic and open repair and she is aware of the risks of complications.  Past Medical History   Diagnosis  Date   .  Umbilical hernia    .  Diabetes mellitus without complication    .  Hypertension    .  Bipolar disorder    .  Scoliosis    .  OA (osteoarthritis) of knee    .  Abdominal pain    .  Generalized headaches    .  Leg swelling    .  Constipation    .  Diarrhea    .  Nausea & vomiting    .  Weakness    .  Easy bruising     Past Surgical History   Procedure  Laterality  Date   .  Hernia mesh removal     .  Abdominal hysterectomy     .  Knee surgery      Current Outpatient Prescriptions   Medication  Sig  Dispense  Refill   .  cholecalciferol (VITAMIN D) 400 UNITS TABS tablet  Take 400 Units by mouth 2 (two) times daily.     .  hydrOXYzine (ATARAX/VISTARIL) 25 MG tablet  Take 25 mg by mouth at bedtime.     Marland Kitchen  lisinopril (PRINIVIL,ZESTRIL) 10 MG tablet  Take 10 mg by mouth daily.     Marland Kitchen  oxyCODONE-acetaminophen (PERCOCET/ROXICET) 5-325 MG per tablet  Take 1 tablet by mouth every 4 (four) hours as needed for severe pain.  30 tablet  0   .  oxyCODONE-acetaminophen (PERCOCET/ROXICET) 5-325 MG per tablet  Take 1-2 tablets by mouth every 4 (four) hours as needed for severe pain.  10 tablet  0    No current facility-administered medications for this visit.   Celebrex; Keflex; Penicillins; and Ultram  No family history on file.  Social History: reports that she has quit smoking. She does not have any smokeless tobacco history on file. She reports that she does not drink alcohol or use illicit drugs.  REVIEW OF SYSTEMS :  Physical Exam:  Blood pressure 126/76, pulse 75,  temperature 97.2 F (36.2 C), height  (1.626 m), weight 197 lb (89.359 kg).  Body mass index is 33.8 kg/(m^2).  Gen: WDWN WF NAD  Neurological: Alert and oriented to person, place, and time. Motor and sensory function is grossly intact  Head: Normocephalic and atraumatic.  Eyes: Conjunctivae are normal. Pupils are equal, round, and reactive to light. No scleral icterus.  Neck: Normal range of motion. Neck supple. No tracheal deviation or thyromegaly present.  Cardiovascular: SR without murmurs or gallops. No carotid bruits  Breast: Not examined  Respiratory: Effort normal. No respiratory distress. No chest wall tenderness. Breath sounds normal. No wheezes, rales or rhonchi.  Abdomen: Fullness around the umbilicus and somewhat tender. No redness  GU: Not examinined  Musculoskeletal: Normal range of motion. Extremities are nontender. No cyanosis, edema or clubbing noted Lymphadenopathy: No cervical, preauricular, postauricular or axillary adenopathy is present Skin: Skin is warm and dry. No rash noted. No diaphoresis. No erythema. No pallor. Pscyh: Normal mood and affect. Behavior is normal. Judgment and thought content normal.  LABORATORY RESULTS:  No results found for  this or any previous visit (from the past 48 hour(s)).  RADIOLOGY RESULTS:  No results found.  Problem List:  Patient Active Problem List    Diagnosis  Date Noted   .  Diabetes  05/10/2014   .  recurrent ventral hernia  05/10/2014   Assessment & Plan:  Recurrent ventral herniae. Will schedule for lap/open ventral hernia repair.  Matt B. Daphine Deutscher, MD, [facility]  [phone] beeper  [phone]

## 2014-07-06 ENCOUNTER — Telehealth (INDEPENDENT_AMBULATORY_CARE_PROVIDER_SITE_OTHER): Payer: Self-pay | Admitting: Surgery

## 2014-07-06 DIAGNOSIS — F419 Anxiety disorder, unspecified: Secondary | ICD-10-CM

## 2014-07-06 DIAGNOSIS — E669 Obesity, unspecified: Secondary | ICD-10-CM | POA: Insufficient documentation

## 2014-07-06 DIAGNOSIS — K469 Unspecified abdominal hernia without obstruction or gangrene: Secondary | ICD-10-CM

## 2014-07-06 NOTE — Op Note (Signed)
NAMETARIA, CASTRILLO NO.:  [id number]  MEDICAL RECORD NO.:  [id number]  LOCATION:  [facility]                         FACILITY:  [facility]  PHYSICIAN:  Thornton Park. Daphine Deutscher, MD  DATE OF BIRTH:  July 18, 1971  DATE OF PROCEDURE:  07/05/2014 DATE OF DISCHARGE:  07/05/2014                              OPERATIVE REPORT   PREOPERATIVE DIAGNOSIS:  Thrice recurrent periumbilical hernia.  POSTOPERATIVE DIAGNOSIS:  Thrice recurrent periumbilical hernia.  SURGEON:  Thornton Park. Daphine Deutscher, M.D.  ASSISTANT:  None.  ANESTHESIA:  General endotracheal.  PROCEDURES:  Laparoscopic ventral hernia repair using a 12-cm round Parietex mesh patch, anchored with permanent sutures in the absorbable ReliaTack.  DESCRIPTION OF PROCEDURE:  The patient was taken to room 1 and given general anesthesia.  The abdomen was prepped with PCO max and draped sterilely.  A time-out was performed.  Access was achieved through the right upper quadrant using a 5-mm Optiview without difficulty.  Abdomen was insufflated and there was a massive omentum stuck up to the previous hernia repairs.  A second 5-mm port was placed in the left lower quadrant and through that, I used scissors and the Harmonic scalpel to take down all the adhesions to the anterior abdominal wall.  With an angled 30-degree 5-mm scope, we were able to see the kind of Swiss cheese defects around the umbilicus.  The area of the patient's pain was below that and although there was an area of slight induration, there was not a definite hernia there.  There was, however, some permanent suture material in that area and I pulled it out and excised and removed it.  There was a second piece of Prolene which I removed as well.  With that being done, I mapped the area inferiorly about a 12 x 12 area and so I used a piece of 12-cm round Parietex.  Four anchoring sutures of #1 Novafil were used at 90 degrees and this was rolled and placed into the abdomen  through an 11-mm port which I placed in the right lower quadrant.  Once this was enrolled, I went ahead and retrieved the 4 anchoring sutures and held it up to the anterior abdominal wall, but prior to tying it, I then secured it to the abdominal wall with the ReliaTack angulating tacker.  This secured it nicely and then the 4 sutures were tied down, completing the securing.  Everything looked good.  There was no bleeding from the omentum below.  The ports were injected with Exparel, and the wounds were closed with 4-0 Vicryl and with Dermabond including the small 4 tacking sutures.  The patient tolerated the procedure well, was taken to the recovery room with an abdominal binder on.     Thornton Park Daphine Deutscher, MD     MBM/MEDQ  D:  07/05/2014  T:  07/05/2014  Job:  [id number]

## 2014-07-06 NOTE — Telephone Encounter (Signed)
Jennifer Barajas  [birth year]Cindia Barajas Patient Care Team: Lizbeth Bark, NP as PCP - General (Nurse Practitioner)  This patient is a 43 y.o.female who calls today for surgical evaluation.   Date of procedure/visit: 07/05/2014  Surgery: POSTOPERATIVE DIAGNOSIS: Thrice recurrent periumbilical hernia.  SURGEON: Thornton Park. Daphine Deutscher, M.D.  ASSISTANT: None.  ANESTHESIA: General endotracheal.  PROCEDURES: Laparoscopic ventral hernia repair using a 12-cm round  Parietex mesh patch, anchored with permanent sutures in the absorbable  ReliaTack.    Reason for call: Pain  Patient called scared and tearful.  She is feeling pain after her surgery.  She is taking oxycodone 10 mg every 4 hours.  She is worried that is not enough.  She says it hurts to strain to urinate.  She is not turned out.  She is tolerating liquids and some food.  She has had temperatures around 100-101.  Noted mild redness at the incisions.  No drainage.  No vomiting.  Passing flatus.  I recommend she had extra strength Tylenol every 6 hours.  An ice pack per heating pad at the 6 times a day.  Get on medicine to keep her bowels regular like MiraLAX.  See if that helps calm things down.  It is safe to increase the oxycodone to 15-20 mg a few times to get on top of the pain and then back off.  If things get worse or become unbearable, go to the emergency room.  She seemed to calm down and expressed appreciation.  Patient Active Problem List   Diagnosis Date Noted  . Diabetes 05/10/2014  . recurrent ventral hernia 05/10/2014    Past Medical History  Diagnosis Date  . Umbilical hernia   . Hypertension   . Bipolar disorder   . Scoliosis   . OA (osteoarthritis) of knee   . Abdominal pain   . Leg swelling   . Constipation   . Weakness   . Easy bruising   . Generalized headaches     migraine headaches  . GERD (gastroesophageal reflux disease)   . Anemia   . Diabetes mellitus     diet controlled  . Difficulty sleeping      Past Surgical History  Procedure Laterality Date  . Hernia mesh removal    . Abdominal hysterectomy    . Knee surgery    . Foot surgery    . Bone spur      rt foot  . Hernia repair      History   Social History  . Marital Status: Married    Spouse Name: N/A    Number of Children: N/A  . Years of Education: N/A   Occupational History  . Not on file.   Social History Main Topics  . Smoking status: Former Smoker    Quit date: 06/29/2011  . Smokeless tobacco: Not on file  . Alcohol Use: No  . Drug Use: No  . Sexual Activity: Not on file   Other Topics Concern  . Not on file   Social History Narrative  . No narrative on file    No family history on file.  Current Outpatient Prescriptions  Medication Sig Dispense Refill  . cholecalciferol (VITAMIN D) 400 UNITS TABS tablet Take 400 Units by mouth 2 (two) times daily.      . hydrOXYzine (ATARAX/VISTARIL) 25 MG tablet Take 25 mg by mouth at bedtime as needed (Sleep).       Marland Kitchen lisinopril (PRINIVIL,ZESTRIL) 10 MG tablet Take 10 mg by mouth  every morning.       Marland Kitchen omeprazole (PRILOSEC) 40 MG capsule Take 40 mg by mouth daily.      Marland Kitchen oxyCODONE (OXY IR/ROXICODONE) 5 MG immediate release tablet Take 1-2 tablets (5-10 mg total) by mouth every 4 (four) hours as needed for moderate pain, severe pain or breakthrough pain.  40 tablet  0  . oxyCODONE-acetaminophen (PERCOCET/ROXICET) 5-325 MG per tablet Take 1 tablet by mouth every 4 (four) hours as needed for severe pain.  30 tablet  0   No current facility-administered medications for this visit.     Allergies  Allergen Reactions  . Celebrex [Celecoxib] Other (See Comments)    Unknown reaction  . Keflex [Cephalexin] Other (See Comments)    Unknown reaction  . Penicillins Hives  . Ultram [Tramadol] Other (See Comments)    Unknown reaction    @  Dg Chest 2 View  06/28/2014   CLINICAL DATA:  Preoperative evaluation.  EXAM: CHEST  2 VIEW  COMPARISON:  Chest radiograph  05/16/2014  FINDINGS: Normal cardiac and mediastinal contours. Lungs are clear. No pleural effusion or pneumothorax. Regional skeleton is unremarkable.  IMPRESSION: No acute cardiopulmonary process.   Electronically Signed   By: Annia Belt M.D.   On: 06/28/2014 14:34    Note: This dictation was prepared with Dragon/digital dictation along with [employer]. Any transcriptional errors that result from this process are unintentional.

## 2014-07-08 ENCOUNTER — Telehealth (INDEPENDENT_AMBULATORY_CARE_PROVIDER_SITE_OTHER): Payer: Self-pay | Admitting: Surgery

## 2014-07-08 NOTE — Telephone Encounter (Signed)
Jennifer Barajas  Dec 10, 1970 [id number]  Patient Care Team: Lizbeth Bark, NP as PCP - General (Nurse Practitioner)  This patient is a 43 y.o.female who calls today for surgical evaluation.   Date of procedure/visit: 07/05/2014  Surgery: POSTOPERATIVE DIAGNOSIS: Thrice recurrent periumbilical hernia.  SURGEON: Thornton Park. Daphine Deutscher, M.D.  ASSISTANT: None.  ANESTHESIA: General endotracheal.  PROCEDURES: Laparoscopic ventral hernia repair using a 12-cm round  Parietex mesh patch, anchored with permanent sutures in the absorbable  ReliaTack.    Reason for call: Pain  Patient called again.  She is still feeling pain after her surgery.  She is taking oxycodone every 4 hours.  She is worried that is not enough.  Drank 8 glasses of fluid but not urinating enough.   She says it hurts to strain to urinate.    I recommended she go to the ED as her Sx are not controlled.    Patient Active Problem List   Diagnosis Date Noted  . Obesity (BMI 30-39.9) 07/06/2014  . Avitaminosis D 05/15/2014  . Diabetes 05/10/2014  . recurrent ventral hernia 05/10/2014  . Acid reflux 05/07/2014  . ANA positive 05/07/2014  . Leg paresthesia 05/01/2014  . Ankle pain 05/01/2014  . Anxiety 04/03/2014    Past Medical History  Diagnosis Date  . Umbilical hernia   . Hypertension   . Bipolar disorder   . Scoliosis   . OA (osteoarthritis) of knee   . Abdominal pain   . Leg swelling   . Constipation   . Weakness   . Easy bruising   . Generalized headaches     migraine headaches  . GERD (gastroesophageal reflux disease)   . Anemia   . Diabetes mellitus     diet controlled  . Difficulty sleeping     Past Surgical History  Procedure Laterality Date  . Hernia mesh removal    . Abdominal hysterectomy    . Knee surgery    . Foot surgery    . Bone spur      rt foot  . Hernia repair      History   Social History  . Marital Status: Married    Spouse Name: N/A    Number of Children: N/A  . Years  of Education: N/A   Occupational History  . Not on file.   Social History Main Topics  . Smoking status: Former Smoker    Quit date: 06/29/2011  . Smokeless tobacco: Not on file  . Alcohol Use: No  . Drug Use: No  . Sexual Activity: Not on file   Other Topics Concern  . Not on file   Social History Narrative  . No narrative on file    No family history on file.  Current Outpatient Prescriptions  Medication Sig Dispense Refill  . cholecalciferol (VITAMIN D) 400 UNITS TABS tablet Take 400 Units by mouth 2 (two) times daily.      . hydrOXYzine (ATARAX/VISTARIL) 25 MG tablet Take 25 mg by mouth at bedtime as needed (Sleep).       Marland Kitchen lisinopril (PRINIVIL,ZESTRIL) 10 MG tablet Take 10 mg by mouth every morning.       Marland Kitchen omeprazole (PRILOSEC) 40 MG capsule Take 40 mg by mouth daily.      Marland Kitchen oxyCODONE (OXY IR/ROXICODONE) 5 MG immediate release tablet Take 1-2 tablets (5-10 mg total) by mouth every 4 (four) hours as needed for moderate pain, severe pain or breakthrough pain.  40 tablet  0  . oxyCODONE-acetaminophen (PERCOCET/ROXICET) 5-325  MG per tablet Take 1 tablet by mouth every 4 (four) hours as needed for severe pain.  30 tablet  0   No current facility-administered medications for this visit.     Allergies  Allergen Reactions  . Celebrex [Celecoxib] Other (See Comments)    Unknown reaction  . Keflex [Cephalexin] Other (See Comments)    Unknown reaction  . Penicillins Hives  . Ultram [Tramadol] Other (See Comments)    Unknown reaction    @  Dg Chest 2 View  06/28/2014   CLINICAL DATA:  Preoperative evaluation.  EXAM: CHEST  2 VIEW  COMPARISON:  Chest radiograph 05/16/2014  FINDINGS: Normal cardiac and mediastinal contours. Lungs are clear. No pleural effusion or pneumothorax. Regional skeleton is unremarkable.  IMPRESSION: No acute cardiopulmonary process.   Electronically Signed   By: Annia Belt M.D.   On: 06/28/2014 14:34    Note: This dictation was prepared with  Dragon/digital dictation along with [employer]. Any transcriptional errors that result from this process are unintentional.

## 2014-07-09 ENCOUNTER — Emergency Department (HOSPITAL_COMMUNITY): Payer: Medicaid Other

## 2014-07-09 ENCOUNTER — Encounter (HOSPITAL_COMMUNITY): Payer: Self-pay | Admitting: Surgery

## 2014-07-09 ENCOUNTER — Emergency Department (HOSPITAL_COMMUNITY)
Admission: EM | Admit: 2014-07-09 | Discharge: 2014-07-09 | Disposition: A | Discharge status: Home / Self Care | Payer: Medicaid Other | Attending: Emergency Medicine | Admitting: Emergency Medicine

## 2014-07-09 DIAGNOSIS — Z87891 Personal history of nicotine dependence: Secondary | ICD-10-CM | POA: Insufficient documentation

## 2014-07-09 DIAGNOSIS — K219 Gastro-esophageal reflux disease without esophagitis: Secondary | ICD-10-CM | POA: Diagnosis not present

## 2014-07-09 DIAGNOSIS — Z880 Allergy status to penicillin: Secondary | ICD-10-CM | POA: Diagnosis not present

## 2014-07-09 DIAGNOSIS — Z79899 Other long term (current) drug therapy: Secondary | ICD-10-CM | POA: Insufficient documentation

## 2014-07-09 DIAGNOSIS — G8918 Other acute postprocedural pain: Secondary | ICD-10-CM | POA: Diagnosis not present

## 2014-07-09 DIAGNOSIS — Z862 Personal history of diseases of the blood and blood-forming organs and certain disorders involving the immune mechanism: Secondary | ICD-10-CM | POA: Insufficient documentation

## 2014-07-09 DIAGNOSIS — I10 Essential (primary) hypertension: Secondary | ICD-10-CM | POA: Diagnosis not present

## 2014-07-09 DIAGNOSIS — Z8739 Personal history of other diseases of the musculoskeletal system and connective tissue: Secondary | ICD-10-CM | POA: Diagnosis not present

## 2014-07-09 DIAGNOSIS — R1032 Left lower quadrant pain: Secondary | ICD-10-CM | POA: Insufficient documentation

## 2014-07-09 DIAGNOSIS — Y838 Other surgical procedures as the cause of abnormal reaction of the patient, or of later complication, without mention of misadventure at the time of the procedure: Secondary | ICD-10-CM | POA: Diagnosis not present

## 2014-07-09 DIAGNOSIS — Z8659 Personal history of other mental and behavioral disorders: Secondary | ICD-10-CM | POA: Insufficient documentation

## 2014-07-09 DIAGNOSIS — E119 Type 2 diabetes mellitus without complications: Secondary | ICD-10-CM | POA: Diagnosis not present

## 2014-07-09 LAB — URINALYSIS, ROUTINE W REFLEX MICROSCOPIC
BILIRUBIN URINE: NEGATIVE
Glucose, UA: NEGATIVE mg/dL
KETONES UR: NEGATIVE mg/dL
NITRITE: NEGATIVE
PH: 7.5 (ref 5.0–8.0)
PROTEIN: NEGATIVE mg/dL
Specific Gravity, Urine: 1.008 (ref 1.005–1.030)
UROBILINOGEN UA: 0.2 mg/dL (ref 0.0–1.0)

## 2014-07-09 LAB — I-STAT CHEM 8, ED
BUN: 12 mg/dL (ref 6–23)
CHLORIDE: 104 meq/L (ref 96–112)
Calcium, Ion: 1.16 mmol/L (ref 1.12–1.23)
Creatinine, Ser: 0.7 mg/dL (ref 0.50–1.10)
GLUCOSE: 91 mg/dL (ref 70–99)
HEMATOCRIT: 34 % — AB (ref 36.0–46.0)
Hemoglobin: 11.6 g/dL — ABNORMAL LOW (ref 12.0–15.0)
Potassium: 3.8 mEq/L (ref 3.7–5.3)
Sodium: 136 mEq/L — ABNORMAL LOW (ref 137–147)
TCO2: 23 mmol/L (ref 0–100)

## 2014-07-09 LAB — URINE MICROSCOPIC-ADD ON

## 2014-07-09 LAB — CBC
HEMATOCRIT: 34.4 % — AB (ref 36.0–46.0)
Hemoglobin: 11.4 g/dL — ABNORMAL LOW (ref 12.0–15.0)
MCH: 27.9 pg (ref 26.0–34.0)
MCHC: 33.1 g/dL (ref 30.0–36.0)
MCV: 84.1 fL (ref 78.0–100.0)
Platelets: 172 10*3/uL (ref 150–400)
RBC: 4.09 MIL/uL (ref 3.87–5.11)
RDW: 13.4 % (ref 11.5–15.5)
WBC: 8.4 10*3/uL (ref 4.0–10.5)

## 2014-07-09 MED ORDER — HYDROMORPHONE HCL PF 1 MG/ML IJ SOLN
1.0000 mg | Freq: Once | INTRAMUSCULAR | Status: AC
  Administered 2014-07-09: 1 mg via INTRAVENOUS
  Filled 2014-07-09: qty 1

## 2014-07-09 MED ORDER — HYDROMORPHONE HCL 2 MG PO TABS: 2.0000 mg | ORAL_TABLET | ORAL | Status: DC | PRN

## 2014-07-09 MED ORDER — POLYETHYLENE GLYCOL 3350 17 G PO PACK: 17.0000 g | PACK | Freq: Every day | ORAL | Status: DC

## 2014-07-09 MED ORDER — SODIUM CHLORIDE 0.9 % IV BOLUS (SEPSIS)
500.0000 mL | Freq: Once | INTRAVENOUS | Status: AC
  Administered 2014-07-09: 500 mL via INTRAVENOUS

## 2014-07-09 MED ORDER — IOHEXOL 300 MG/ML  SOLN
100.0000 mL | Freq: Once | INTRAMUSCULAR | Status: AC | PRN
  Administered 2014-07-09: 100 mL via INTRAVENOUS
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
## 2014-07-09 NOTE — ED Notes (Signed)
AVS explained in detail. No other questions/concerns. Knows not to drink alcohol/drive/operate heavy machinery. Knows to take Miralax to prevent constipation. Pt is ambulatory with steady gait. Is not driving home.

## 2014-07-09 NOTE — Discharge Instructions (Signed)
Pain Relief Preoperatively and Postoperatively °Being a good patient does not mean being a silent one. If you have questions, problems, or concerns about the pain you may feel after surgery, let your caregiver know. Patients have the right to assessment and management of pain. The treatment of pain after surgery is important to speed up recovery and return to normal activities. Severe pain after surgery, and the fear or anxiety associated with that pain, may cause extreme discomfort that: °· Prevents sleep. °· Decreases the ability to breathe deeply and cough. This can cause pneumonia or other upper airway infections. °· Causes your heart to beat faster and your blood pressure to be higher. °· Increases the risk for constipation and bloating. °· Decreases the ability of wounds to heal. °· May result in depression, increased anxiety, and feelings of helplessness. °Relief of pain before surgery is also important because it will lessen the pain after surgery. Patients who receive both pain relief before and after surgery experience greater pain relief than those who only receive pain relief after surgery. Let your caregiver know if you are having uncontrolled pain. This is very important. Pain after surgery is more difficult to manage if it is permitted to become severe, so prompt and adequate treatment of acute pain is necessary. °PAIN CONTROL METHODS °Your caregivers follow policies and procedures about the management of patient pain. These guidelines should be explained to you before surgery. Plans for pain control after surgery must be mutually decided upon and instituted with your full understanding and agreement. Do not be afraid to ask questions regarding the care you are receiving. There are many different ways your caregivers will attempt to control your pain, including the following methods. °As needed pain control °· You may be given pain medicine either through your intravenous (IV) tube, or as a pill or  liquid you can swallow. You will need to let your caregiver know when you are having pain. Then, your caregiver will give you the pain medicine ordered for you. °· Your pain medicine may make you constipated. If constipation occurs, drink more liquids if you can. Your caregiver may have you take a mild laxative. °IV patient-controlled analgesia pump (PCA pump) °· You can get your pain medicine through the IV tube which goes into your vein. You are able to control the amount of pain medicine that you get. The pain medicine flows in through an IV tube and is controlled by a pump. This pump gives you a set amount of pain medicine when you push the button hooked up to it. Nobody should push this button but you or someone specifically assigned by you to do so. It is set up to keep you from accidentally giving yourself too much pain medicine. You will be able to start using your pain pump in the recovery room after your surgery. This method can be helpful for most types of surgery. °· If you are still having too much pain, tell your caregiver. Also, tell your caregiver if you are feeling too sleepy or nauseous. °Continuous epidural pain control °· A thin, soft tube (catheter) is put into your back. Pain medicine flows through the catheter to lessen pain in the part of your body where the surgery is done. Continuous epidural pain control may work best for you if you are having surgery on your chest, abdomen, hip area, or legs. The epidural catheter is usually put into your back just before surgery. The catheter is left in until you can eat and take medicine by mouth. In most cases,   this may take 2 to 3 days. °· Giving pain medicine through the epidural catheter may help you heal faster because: °¨ Your bowel gets back to normal faster. °¨ You can get back to eating sooner. °¨ You can be up and walking sooner. °Medicine that numbs the area (local anesthetic) °· You may receive an injection of pain medicine near where the  pain is (local infiltration). °· You may receive an injection of pain medicine near the nerve that controls the sensation to a specific part of the body (peripheral nerve block). °· Medicine may be put in the spine to block pain (spinal block). °Opioids °· Moderate to moderately severe acute pain after surgery may respond to opioids. Opioids are narcotic pain medicine. Opioids are often combined with non-narcotic medicines to improve pain relief, diminish the risk of side effects, and reduce the chance of addiction. °· If you follow your caregiver's directions about taking opioids and you do not have a history of substance abuse, your risk of becoming addicted is exceptionally small. Opioids are given for short periods of time in careful doses to prevent addiction. °Other methods of pain control include: °· Steroids. °· Physical therapy. °· Heat and cold therapy. °· Compression, such as wrapping an elastic bandage around the area of pain. °· Massage. °These various ways of controlling pain may be used together. Combining different methods of pain control is called multimodal analgesia. Using this approach has many benefits, including being able to eat, move around, and leave the hospital sooner. °Document Released: 01/08/2003 Document Revised: 01/10/2012 Document Reviewed: 01/12/2011 °[employer]® Patient Information ©2015 [employer]. This information is not intended to replace advice given to you by your health care provider. Make sure you discuss any questions you have with your health care provider. ° °

## 2014-07-09 NOTE — ED Provider Notes (Signed)
CSN: [id number]     Arrival date & time 07/09/14  1417 History   First MD Initiated Contact with Patient 07/09/14 1633     Chief Complaint  Patient presents with  . Post-op Problem  . Dizziness     (Consider location/radiation/quality/duration/timing/severity/associated sxs/prior Treatment) Patient is a 43 y.o. female presenting with dizziness. The history is provided by the patient.  Dizziness Quality:  Lightheadedness Associated symptoms: no chest pain, no diarrhea, no headaches, no nausea, no shortness of breath and no vomiting    patient had an umbilical hernia repair 4 days ago. She states she has had severe pain has been uncontrolled with four 5 mg tablets of oxycodone at a time.   she states she has had temperatures of 100-101. She's had difficulty urinating. She has had one bowel movement hasn't passed gas. She's been discussing this with the surgeons over the weekend. She states it seemed as if they didn't care. He states that there is no bruising. He states the pain is so severe it is unmanageable. She states she has not had this kind of pain with previous surgeries. Patient thinks that she needs something stronger like Vicodin. Patient states she feels as if she has to urinate, but only little comes out.  Past Medical History  Diagnosis Date  . Umbilical hernia   . Hypertension   . Bipolar disorder   . Scoliosis   . OA (osteoarthritis) of knee   . Abdominal pain   . Leg swelling   . Constipation   . Weakness   . Easy bruising   . Generalized headaches     migraine headaches  . GERD (gastroesophageal reflux disease)   . Anemia   . Diabetes mellitus     diet controlled  . Difficulty sleeping    Past Surgical History  Procedure Laterality Date  . Hernia mesh removal    . Abdominal hysterectomy    . Knee surgery    . Foot surgery    . Bone spur      rt foot  . Hernia repair    . Ventral hernia repair N/A 07/05/2014    Procedure: LAPRASCOPIC VENTRAL HERNIA REPAIR  WITH MESH;  Surgeon: Wenda Low, MD;  Location: [facility];  Service: General;  Laterality: N/A;  . Insertion of mesh  07/05/2014    Procedure: INSERTION OF MESH;  Surgeon: Wenda Low, MD;  Location: [facility];  Service: General;;   No family history on file. History  Substance Use Topics  . Smoking status: Former Smoker    Quit date: 06/29/2011  . Smokeless tobacco: Not on file  . Alcohol Use: No   OB History   Grav Para Term Preterm Abortions TAB SAB Ect Mult Living                 Review of Systems  Constitutional: Negative for activity change and appetite change.  Eyes: Negative for pain.  Respiratory: Negative for chest tightness and shortness of breath.   Cardiovascular: Negative for chest pain and leg swelling.  Gastrointestinal: Positive for abdominal pain. Negative for nausea, vomiting and diarrhea.  Genitourinary: Negative for flank pain.  Musculoskeletal: Negative for back pain and neck stiffness.  Skin: Positive for wound. Negative for rash.  Neurological: Positive for dizziness. Negative for weakness, numbness and headaches.  Hematological: Bruises/bleeds easily.  Psychiatric/Behavioral: Negative for behavioral problems.      Allergies  Celebrex; Keflex; Penicillins; and Ultram  Home Medications   Prior to Admission medications  Medication Sig Start Date End Date Taking? Authorizing Provider  hydrOXYzine (ATARAX/VISTARIL) 25 MG tablet Take 25 mg by mouth at bedtime as needed (Sleep).    Yes Historical Provider, MD  lisinopril (PRINIVIL,ZESTRIL) 10 MG tablet Take 10 mg by mouth every morning.    Yes Historical Provider, MD  omeprazole (PRILOSEC) 20 MG capsule Take 20 mg by mouth daily before breakfast.   Yes Historical Provider, MD  oxyCODONE (OXY IR/ROXICODONE) 5 MG immediate release tablet Take 1-2 tablets (5-10 mg total) by mouth every 4 (four) hours as needed for moderate pain, severe pain or breakthrough pain. 07/05/14  Yes Avel Peace, MD  HYDROmorphone  (DILAUDID) 2 MG tablet Take 1 tablet (2 mg total) by mouth every 4 (four) hours as needed for moderate pain or severe pain. 07/09/14   Juliet Rude. Lekeshia Kram, MD  polyethylene glycol (MIRALAX) packet Take 17 g by mouth daily. 07/09/14   Juliet Rude. Winthrop Shannahan, MD   BP 122/71  Pulse 82  Temp(Src) 98.5 F (36.9 C) (Oral)  Resp 18  SpO2 99% Physical Exam  Nursing note and vitals reviewed. Constitutional: She is oriented to person, place, and time. She appears well-developed and well-nourished.  HENT:  Head: Normocephalic and atraumatic.  Patient is edentulous  Eyes: EOM are normal. Pupils are equal, round, and reactive to light.  Neck: Normal range of motion. Neck supple.  Cardiovascular: Normal rate, regular rhythm and normal heart sounds.   No murmur heard. Pulmonary/Chest: Effort normal and breath sounds normal. No respiratory distress. She has no wheezes. She has no rales.  Abdominal: Soft. Bowel sounds are normal. She exhibits no distension. There is tenderness. There is no rebound and no guarding.  Patient is diffusely tender, but worse in the left lower quadrant. There is a port site in the left lower quadrant with some ecchymosis. Tenderness is most severe in this area. There is ecchymosis tracking laterally. In her left flank area there is some slight ecchymosis under the skin. No tenderness at the site.  Musculoskeletal: Normal range of motion.  Neurological: She is alert and oriented to person, place, and time. No cranial nerve deficit.  Skin: Skin is warm and dry.  Psychiatric: She has a normal mood and affect. Her speech is normal.    ED Course  Procedures (including critical care time) Labs Review Labs Reviewed  CBC - Abnormal; Notable for the following:    Hemoglobin 11.4 (*)    HCT 34.4 (*)    All other components within normal limits  URINALYSIS, ROUTINE W REFLEX MICROSCOPIC - Abnormal; Notable for the following:    APPearance CLOUDY (*)    Hgb urine dipstick TRACE (*)     Leukocytes, UA TRACE (*)    All other components within normal limits  URINE MICROSCOPIC-ADD ON - Abnormal; Notable for the following:    Squamous Epithelial / LPF FEW (*)    All other components within normal limits  I-STAT CHEM 8, ED - Abnormal; Notable for the following:    Sodium 136 (*)    Hemoglobin 11.6 (*)    HCT 34.0 (*)    All other components within normal limits    Imaging Review Ct Abdomen Pelvis W Contrast  07/09/2014   CLINICAL DATA:  Left lower quadrant pain and bruising. Postop from hernia repair.  EXAM: CT ABDOMEN AND PELVIS WITH CONTRAST  TECHNIQUE: Multidetector CT imaging of the abdomen and pelvis was performed using the standard protocol following bolus administration of intravenous contrast.  CONTRAST:   OMNIPAQUE IOHEXOL 300 MG/ML  SOLN  COMPARISON:  05/16/2014  FINDINGS: Liver:  No masses or other significant abnormality identified.  Gallbladder/Biliary:  Unremarkable.  Pancreas: No mass, inflammatory changes, or other parenchymal abnormality identified.  Spleen:  Within normal limits in size and appearance.  Adrenal Glands:  No mass identified.  Kidneys/Urinary Tract: No masses identified. No evidence of hydronephrosis.  Lymph Nodes:  No pathologically enlarged lymph nodes identified.  Pelvic/Reproductive Organs: Prior hysterectomy noted. Adnexal regions are unremarkable in appearance.  Bowel/Peritoneum: No evidence of bowel wall thickening, mass, or obstruction.  Vascular:  No evidence of abdominal aortic aneurysm.  Musculoskeletal:  No suspicious bone lesions identified.  Other: Subcutaneous emphysema is seen in the lower anterior abdominal wall, however there is no evidence of hematoma, abscess, or recurrent hernia. Mild bibasilar atelectasis noted.  IMPRESSION: Expected postoperative changes from recent ventral abdominal wall hernia repair. No evidence of hematoma, abscess, or other complication. No evidence of recurrent hernia.  Mild bibasilar atelectasis.    Electronically Signed   By: Myles Rosenthal M.D.   On: 07/09/2014 18:48     EKG Interpretation None      MDM   Final diagnoses:  Post-operative pain    Patient with lower abdominal pain after surgery. Labs reassuring. No UTI. CT scan shows expected changes. Some symptoms could be due to pain for have a Foley catheter. Pain uncontrolled oxycodone. Patient states that she told him oxycodone would not work and she would need something was. Patient states that her medicine anyway. Will discharge home with Dilaudid. He'll followup with Dr. Norberto Sorenson R. Rubin Payor, MD 07/09/14 [id number]

## 2014-07-09 NOTE — ED Notes (Addendum)
Pt had laproscopic surgery on Friday to fix hernia. Pt noticed today reddened area and swelling to left hip area.  Pt states that her brother in law who is EMS worker todl her it looks like internal bleeding and to get to ED immediately. Pt c/o pain, dizziness.  Pt also states that she noticed blood in her urine yesterday. And she still hasnt had a BM.  Pt states that she has to take 4 oxys for them to even work.

## 2014-07-09 NOTE — ED Notes (Signed)
Patient transported to CT 

## 2014-07-09 NOTE — ED Notes (Signed)
Pt aware we need urine sample.  

## 2014-07-10 ENCOUNTER — Telehealth (INDEPENDENT_AMBULATORY_CARE_PROVIDER_SITE_OTHER): Payer: Self-pay

## 2014-07-10 NOTE — Telephone Encounter (Signed)
Patient status post Umbilical Hernia Repair on 07/05/14.  Patient calling into office to report that a small piece of surgical glue came off of her incision.  Patient denies having any bleeding.  Patient states she went to the ED last night due to uncontrollable pain.  Patient also states that she was told by a family member that works for EMS "she's having internal bleeding and needs to be seen in ED"  patient states she had an abdominal CT.  Reviewed the ED notes and CT report in EPIC from visit.  Advised patient that her CT results were normal and the ED notes do not indicate any post surgical complications.  Spoke to patient at length regarding what to expect after hernia repair surgery.  Patient states her pain has not been controlled well with pain medications.  Advised patient that the Dilaudid prescription should help with controlling her pain.  Patient advised to continue taking her pain medication as directed.  Patient advised to call our office if she has any questions or concerns.  Patient verbalized understanding.

## 2014-07-11 ENCOUNTER — Telehealth (INDEPENDENT_AMBULATORY_CARE_PROVIDER_SITE_OTHER): Payer: Self-pay | Admitting: Surgery

## 2014-07-11 NOTE — Telephone Encounter (Signed)
The patient called about continued pain.  She has multiple complaints - she did not want laparoscopic surgery, she is bruised, she went to the ER 2 nights ago for the pain.  They did a CT scan, which shows evidence of bruising, but no significant abnormality.  She has a lot of questions that Dr. Daphine Deutscher will have to address.  She is to call our office tomorrow.  Ovidio Kin, MD, [facility] Surgery Pager: [phone] Office phone:  [phone]

## 2014-07-16 ENCOUNTER — Telehealth (INDEPENDENT_AMBULATORY_CARE_PROVIDER_SITE_OTHER): Payer: Self-pay

## 2014-07-16 NOTE — Telephone Encounter (Signed)
Patient calling into office to complain about knot on her incision that has grown in size since Sunday.  Patient reports she's having an increase in pain.  Patient advised she needs to be seen by Dr. Daphine Deutscher for further evaluation of the area of concern.  Patient advised that Dr. Daphine Deutscher will be in the office all day tomorrow and needs to be seen.  patient states she has appointment in the morning and not sure if she can make it to our office.  Patient advised that Dr. Daphine Deutscher will be here all day and we will make accomodations on his schedule for patient to be worked into his clinic.  Patient requesting something for pain.  patient advised that we are unable to write a pain prescription at 5:00pm.  Advised patient that she needs to be seen for further assessment due to an increase in her pain and symptoms before receiving any further pain prescriptions since she continues to have increasing pain.  Patient wanted to know if the ED would give her pain medication.  Again, advised patient that she needs to be evaluated by Dr. Daphine Deutscher before receiving more pain medication since she's having uncontrolled post operative pain and knot on her incision.  Patient will call our office tomorrow.  Patient made aware that I would document a note in her chart.  Note documented in patient [employer] chart.  Patient status post Ventral Hernia Repair.

## 2014-07-18 ENCOUNTER — Telehealth (INDEPENDENT_AMBULATORY_CARE_PROVIDER_SITE_OTHER): Payer: Self-pay | Admitting: Surgery

## 2014-07-18 NOTE — Telephone Encounter (Signed)
Jennifer Barajas  04/20/1971 [id number]  Patient Care Team: Jennifer Bark, NP as PCP - General (Nurse Practitioner)  This patient is a 43 y.o.female who calls today for surgical evaluation.    Surgery:   DATE OF PROCEDURE: 07/05/2014   OPERATIVE REPORT  PREOPERATIVE DIAGNOSIS: Thrice recurrent periumbilical hernia.  POSTOPERATIVE DIAGNOSIS: Thrice recurrent periumbilical hernia.   SURGEON: Jennifer Barajas, M.D.   PROCEDURES: Laparoscopic ventral hernia repair using a 12-cm round  Parietex mesh patch, anchored with permanent sutures in the absorbable  ReliaTack.   Reason for call: Complaints  The patient calls again.  As usual, she calls from the office is closed & in the evening.  She has concern about a lump on the side of her abdomen near one of her port site incisions.  It is increased from the size of a nickel to a half-dollar.  It is not draining.  She notes that her stools are dark.  She is convinced that she is having rectal bleeding.  She states for certain she does not have hemorrhoids.  She knows for certain that she is anemic.  (Hgb 11.4 - 12.8 past 6 months).  She denies taking any aspirin or nonsteroidals.  She is not on any anticoagulation/blood thinners.  She says she has occasionally felt lightheaded the past few days but has not passed out.  She is eating fine.  No nausea or vomiting.  No constipation or diarrhea.  She sounds very calm.  She was not as tearful and crying and she was last week.  She was not as emotional as last week.  She went to the emergency room last week.  Had normal CAT scan without any major hematoma recurrent hernia or other problems.  I recommended she come to the office sooner than her appointment in 2 weeks.  Call in the morning when the offices open to arrange an appointment.  She wanted more advice.  I strongly recommend that she come in to the office and have someone physically take a look at her to have better address her concerns.  I  noted if her symptoms are unbearable, she can go to the emergency room to be evaluated, But it did not sound like she had any life threatening problems at this time.  Just understandably unknowing/frustrating concerns.  I noted some bruising and swelling at port sites expected.  She wished to contradict me.  In the end, I recommend that someone physically look at her in the office.  I noted I cannot help her much on the phone only, Especially since she seemed to want to contradict my advice.  Patient Active Problem List   Diagnosis Date Noted  . Obesity (BMI 30-39.9) 07/06/2014  . Avitaminosis D 05/15/2014  . Diabetes 05/10/2014  . recurrent ventral hernia 05/10/2014  . Acid reflux 05/07/2014  . ANA positive 05/07/2014  . Leg paresthesia 05/01/2014  . Ankle pain 05/01/2014  . Anxiety 04/03/2014    Past Medical History  Diagnosis Date  . Umbilical hernia   . Hypertension   . Bipolar disorder   . Scoliosis   . OA (osteoarthritis) of knee   . Abdominal pain   . Leg swelling   . Constipation   . Weakness   . Easy bruising   . Generalized headaches     migraine headaches  . GERD (gastroesophageal reflux disease)   . Anemia   . Diabetes mellitus     diet controlled  . Difficulty sleeping  Past Surgical History  Procedure Laterality Date  . Hernia mesh removal    . Abdominal hysterectomy    . Knee surgery    . Foot surgery    . Bone spur      rt foot  . Hernia repair    . Ventral hernia repair N/A 07/05/2014    Procedure: LAPRASCOPIC VENTRAL HERNIA REPAIR WITH MESH;  Surgeon: Wenda Low, MD;  Location: [facility];  Service: General;  Laterality: N/A;  . Insertion of mesh  07/05/2014    Procedure: INSERTION OF MESH;  Surgeon: Wenda Low, MD;  Location: [facility];  Service: General;;    History   Social History  . Marital Status: Married    Spouse Name: N/A    Number of Children: N/A  . Years of Education: N/A   Occupational History  . Not on file.   Social History Main  Topics  . Smoking status: Former Smoker    Quit date: 06/29/2011  . Smokeless tobacco: Not on file  . Alcohol Use: No  . Drug Use: No  . Sexual Activity: Not on file   Other Topics Concern  . Not on file   Social History Narrative  . No narrative on file    No family history on file.  Current Outpatient Prescriptions  Medication Sig Dispense Refill  . HYDROmorphone (DILAUDID) 2 MG tablet Take 1 tablet (2 mg total) by mouth every 4 (four) hours as needed for moderate pain or severe pain.  15 tablet  0  . hydrOXYzine (ATARAX/VISTARIL) 25 MG tablet Take 25 mg by mouth at bedtime as needed (Sleep).       Marland Kitchen lisinopril (PRINIVIL,ZESTRIL) 10 MG tablet Take 10 mg by mouth every morning.       Marland Kitchen omeprazole (PRILOSEC) 20 MG capsule Take 20 mg by mouth daily before breakfast.      . oxyCODONE (OXY IR/ROXICODONE) 5 MG immediate release tablet Take 1-2 tablets (5-10 mg total) by mouth every 4 (four) hours as needed for moderate pain, severe pain or breakthrough pain.  40 tablet  0  . polyethylene glycol (MIRALAX) packet Take 17 g by mouth daily.  14 each  0   No current facility-administered medications for this visit.     Allergies  Allergen Reactions  . Celebrex [Celecoxib] Other (See Comments)    Unknown reaction  . Keflex [Cephalexin] Other (See Comments)    Unknown reaction  . Penicillins Hives  . Ultram [Tramadol] Other (See Comments)    Unknown reaction    Results for [name], Barajas (MRN [id number]) as of 07/18/2014 18:38  Ref. Range 01/15/2014 01:00 05/16/2014 18:05 06/28/2014 14:15 07/09/2014 15:17 07/09/2014 15:38  WBC Latest Range: 4.0-10.5 K/uL 8.3 6.0 7.4 8.4   RBC Latest Range: 3.87-5.11 MIL/uL 4.20 4.15 4.46 4.09   Hemoglobin Latest Range: 12.0-15.0 g/dL 91.4 (L) 78.2 (L) 95.6 11.4 (L) 11.6 (L)  HCT Latest Range: 36.0-46.0 % 35.4 (L) 33.9 (L) 37.7 34.4 (L) 34.0 (L)  MCV Latest Range: 78.0-100.0 fL 84.3 81.7 84.5 84.1   MCH Latest Range: 26.0-34.0 pg 28.3 28.2 28.7 27.9    MCHC Latest Range: 30.0-36.0 g/dL 21.3 08.6 57.8 46.9   RDW Latest Range: 11.5-15.5 % 13.2 13.1 13.3 13.4   Platelets Latest Range: 150-400 K/uL 225 176 201 172     @  Dg Chest 2 View  06/28/2014   CLINICAL DATA:  Preoperative evaluation.  EXAM: CHEST  2 VIEW  COMPARISON:  Chest radiograph 05/16/2014  FINDINGS: Normal cardiac  and mediastinal contours. Lungs are clear. No pleural effusion or pneumothorax. Regional skeleton is unremarkable.  IMPRESSION: No acute cardiopulmonary process.   Electronically Signed   By: Annia Belt M.D.   On: 06/28/2014 14:34   Ct Abdomen Pelvis W Contrast  07/09/2014   CLINICAL DATA:  Left lower quadrant pain and bruising. Postop from hernia repair.  EXAM: CT ABDOMEN AND PELVIS WITH CONTRAST  TECHNIQUE: Multidetector CT imaging of the abdomen and pelvis was performed using the standard protocol following bolus administration of intravenous contrast.  CONTRAST:  OMNIPAQUE IOHEXOL 300 MG/ML  SOLN  COMPARISON:  05/16/2014  FINDINGS: Liver:  No masses or other significant abnormality identified.  Gallbladder/Biliary:  Unremarkable.  Pancreas: No mass, inflammatory changes, or other parenchymal abnormality identified.  Spleen:  Within normal limits in size and appearance.  Adrenal Glands:  No mass identified.  Kidneys/Urinary Tract: No masses identified. No evidence of hydronephrosis.  Lymph Nodes:  No pathologically enlarged lymph nodes identified.  Pelvic/Reproductive Organs: Prior hysterectomy noted. Adnexal regions are unremarkable in appearance.  Bowel/Peritoneum: No evidence of bowel wall thickening, mass, or obstruction.  Vascular:  No evidence of abdominal aortic aneurysm.  Musculoskeletal:  No suspicious bone lesions identified.  Other: Subcutaneous emphysema is seen in the lower anterior abdominal wall, however there is no evidence of hematoma, abscess, or recurrent hernia. Mild bibasilar atelectasis noted.  IMPRESSION: Expected postoperative changes from recent  ventral abdominal wall hernia repair. No evidence of hematoma, abscess, or other complication. No evidence of recurrent hernia.  Mild bibasilar atelectasis.   Electronically Signed   By: Myles Rosenthal M.D.   On: 07/09/2014 18:48    Note: This dictation was prepared with Dragon/digital dictation along with [employer]. Any transcriptional errors that result from this process are unintentional.

## 2014-07-22 ENCOUNTER — Telehealth (INDEPENDENT_AMBULATORY_CARE_PROVIDER_SITE_OTHER): Payer: Self-pay | Admitting: General Surgery

## 2014-07-22 NOTE — Telephone Encounter (Signed)
Pt s/p lap recurrent VHR by Dr Daphine Deutscher earlier in month calls in c/o dizziness, ongoing pain, migraines, left sided trocar site glue off with skin separated, blood in BMs, intolerance to solids (gets full real quick). Pt has called in multiple times since sx. States she was supposed to see Dr Ezzard Standing this past Friday but her ride was late and couldn't make appt. States she is also out of pain meds. States they were also supposed to give her refill at that appt but since she couldn't make appt time she didn't get Rx. No f/c/n/v. Pt calm on phone. Incision not draining. Advised her to take what she normally takes for migraines. States she had BM earlier today with blood in stool. Pt denies taking blood thinners. States she can't asa, nsaids b/c of her anemia. She's concerned about lack of ability to eat/sensation of getting full. States that Dr Daphine Deutscher told her he put a "band" in her abdomen b/t intestines. Advised her that he didn't do that but rather put a piece of mesh against her abdominal wall. Reviewed her ct from her postop ED trip (normal except for typical subcu changes) and labs. Advised her that only options I had for her were to go to ED to be evaluated if she felt she needed to be seen by a physician or our staff would call her in am and check on her and get her an urgent office appt. She has appt with dr Daphine Deutscher on Thursday. Pt tolerating liquids. Advised her to drink meal replacement shake and plenty of liquids. Stated that if our office had indicated a rx was ready she could simply pick it up tomorrow. Pt voiced understanding.

## 2014-07-25 ENCOUNTER — Other Ambulatory Visit (INDEPENDENT_AMBULATORY_CARE_PROVIDER_SITE_OTHER): Payer: Self-pay

## 2014-08-11 ENCOUNTER — Telehealth (INDEPENDENT_AMBULATORY_CARE_PROVIDER_SITE_OTHER): Payer: Self-pay | Admitting: General Surgery

## 2014-08-11 NOTE — Telephone Encounter (Signed)
Patient called with multiple complaints.  She states she "is allergic to the mesh that was placed in her".  She continues to have abd pain, fevers and nausea.  We discussed ways to manage these at home.  If this didn't work, I recommended going to the ED.  She refused and demanded to talk to Dr Daphine DeutscherMartin.  I told her that I would send him a message to call her in the morning.

## 2014-08-12 ENCOUNTER — Telehealth (INDEPENDENT_AMBULATORY_CARE_PROVIDER_SITE_OTHER): Payer: Self-pay

## 2014-08-12 NOTE — Telephone Encounter (Signed)
Pt called today to report N&V 2 days.  She reports temp of 102 last night. Tylenol brought it down to 100.  She called on-call physician and was reportedly told to call the office today for Dr. Daphine DeutscherMartin.  He is at hospital today, but we can send message.  Pt wants him to know she thinks she is having an allergic reaction to the mesh.  She reports feeling sleepy, having stomach pain, pain unrelieved by the Percocet.  She rated her pain as 9.5.  She reports she feels like something is ripping in her stomach "where the anchor is". She wants to wait to find out what Dr. Daphine DeutscherMartin recommends.  Informed pt he may be able to get message later in the day.  She verbalized understanding.  Someone will call her back after we receive a response from the doctor. Call back number [phone]. SL

## 2014-08-14 ENCOUNTER — Telehealth (INDEPENDENT_AMBULATORY_CARE_PROVIDER_SITE_OTHER): Payer: Self-pay

## 2014-08-14 NOTE — Telephone Encounter (Signed)
Dr. Daphine DeutscherMartin called and spoke to patient to make aware that he's in the office today and tomorrow and we will be more than happy to see her.  Patient states she cannot come today due to transportation.  But, patient was advised we will see her tomorrow

## 2014-08-16 ENCOUNTER — Encounter (HOSPITAL_COMMUNITY): Payer: Self-pay | Admitting: Emergency Medicine

## 2014-08-16 ENCOUNTER — Emergency Department (HOSPITAL_COMMUNITY)
Admission: EM | Admit: 2014-08-16 | Discharge: 2014-08-16 | Disposition: A | Discharge status: Home / Self Care | Payer: Medicaid Other | Attending: Emergency Medicine | Admitting: Emergency Medicine

## 2014-08-16 ENCOUNTER — Emergency Department (HOSPITAL_COMMUNITY): Payer: Medicaid Other

## 2014-08-16 ENCOUNTER — Telehealth (INDEPENDENT_AMBULATORY_CARE_PROVIDER_SITE_OTHER): Payer: Self-pay

## 2014-08-16 DIAGNOSIS — Z79899 Other long term (current) drug therapy: Secondary | ICD-10-CM | POA: Insufficient documentation

## 2014-08-16 DIAGNOSIS — R51 Headache: Secondary | ICD-10-CM | POA: Diagnosis not present

## 2014-08-16 DIAGNOSIS — M179 Osteoarthritis of knee, unspecified: Secondary | ICD-10-CM | POA: Diagnosis not present

## 2014-08-16 DIAGNOSIS — D649 Anemia, unspecified: Secondary | ICD-10-CM | POA: Insufficient documentation

## 2014-08-16 DIAGNOSIS — R197 Diarrhea, unspecified: Secondary | ICD-10-CM | POA: Insufficient documentation

## 2014-08-16 DIAGNOSIS — Z3202 Encounter for pregnancy test, result negative: Secondary | ICD-10-CM | POA: Diagnosis not present

## 2014-08-16 DIAGNOSIS — Z8659 Personal history of other mental and behavioral disorders: Secondary | ICD-10-CM | POA: Insufficient documentation

## 2014-08-16 DIAGNOSIS — R1110 Vomiting, unspecified: Secondary | ICD-10-CM | POA: Insufficient documentation

## 2014-08-16 DIAGNOSIS — R0602 Shortness of breath: Secondary | ICD-10-CM | POA: Diagnosis not present

## 2014-08-16 DIAGNOSIS — Z9889 Other specified postprocedural states: Secondary | ICD-10-CM | POA: Diagnosis not present

## 2014-08-16 DIAGNOSIS — Z880 Allergy status to penicillin: Secondary | ICD-10-CM | POA: Diagnosis not present

## 2014-08-16 DIAGNOSIS — Z87891 Personal history of nicotine dependence: Secondary | ICD-10-CM | POA: Diagnosis not present

## 2014-08-16 DIAGNOSIS — R1032 Left lower quadrant pain: Secondary | ICD-10-CM | POA: Diagnosis not present

## 2014-08-16 DIAGNOSIS — K219 Gastro-esophageal reflux disease without esophagitis: Secondary | ICD-10-CM | POA: Diagnosis not present

## 2014-08-16 DIAGNOSIS — I10 Essential (primary) hypertension: Secondary | ICD-10-CM | POA: Insufficient documentation

## 2014-08-16 DIAGNOSIS — Z90710 Acquired absence of both cervix and uterus: Secondary | ICD-10-CM | POA: Diagnosis not present

## 2014-08-16 DIAGNOSIS — E119 Type 2 diabetes mellitus without complications: Secondary | ICD-10-CM | POA: Insufficient documentation

## 2014-08-16 DIAGNOSIS — Z8669 Personal history of other diseases of the nervous system and sense organs: Secondary | ICD-10-CM | POA: Diagnosis not present

## 2014-08-16 DIAGNOSIS — R079 Chest pain, unspecified: Secondary | ICD-10-CM | POA: Diagnosis present

## 2014-08-16 HISTORY — DX: Systemic involvement of connective tissue, unspecified: M35.9

## 2014-08-16 LAB — CBC
HCT: 35.8 % — ABNORMAL LOW (ref 36.0–46.0)
Hemoglobin: 12.1 g/dL (ref 12.0–15.0)
MCH: 28.2 pg (ref 26.0–34.0)
MCHC: 33.8 g/dL (ref 30.0–36.0)
MCV: 83.4 fL (ref 78.0–100.0)
Platelets: 194 10*3/uL (ref 150–400)
RBC: 4.29 MIL/uL (ref 3.87–5.11)
RDW: 13.4 % (ref 11.5–15.5)
WBC: 6.9 10*3/uL (ref 4.0–10.5)

## 2014-08-16 LAB — COMPREHENSIVE METABOLIC PANEL
ALBUMIN: 3.6 g/dL (ref 3.5–5.2)
ALK PHOS: 64 U/L (ref 39–117)
ALT: 24 U/L (ref 0–35)
ANION GAP: 12 (ref 5–15)
AST: 21 U/L (ref 0–37)
BUN: 6 mg/dL (ref 6–23)
CHLORIDE: 106 meq/L (ref 96–112)
CO2: 24 mEq/L (ref 19–32)
Calcium: 9.1 mg/dL (ref 8.4–10.5)
Creatinine, Ser: 0.77 mg/dL (ref 0.50–1.10)
GFR calc Af Amer: >90 mL/min (ref >90)
GFR calc non Af Amer: >90 mL/min (ref >90)
Glucose, Bld: 89 mg/dL (ref 70–99)
POTASSIUM: 4.7 meq/L (ref 3.7–5.3)
Sodium: 142 mEq/L (ref 137–147)
Total Bilirubin: 0.3 mg/dL (ref 0.3–1.2)
Total Protein: 7.1 g/dL (ref 6.0–8.3)

## 2014-08-16 LAB — URINALYSIS, ROUTINE W REFLEX MICROSCOPIC
Bilirubin Urine: NEGATIVE
GLUCOSE, UA: NEGATIVE mg/dL
Ketones, ur: NEGATIVE mg/dL
Leukocytes, UA: NEGATIVE
Nitrite: NEGATIVE
Protein, ur: NEGATIVE mg/dL
SPECIFIC GRAVITY, URINE: 1.012 (ref 1.005–1.030)
Urobilinogen, UA: 0.2 mg/dL (ref 0.0–1.0)
pH: 6 (ref 5.0–8.0)

## 2014-08-16 LAB — I-STAT TROPONIN, ED: Troponin i, poc: 0 ng/mL (ref 0.00–0.08)

## 2014-08-16 LAB — URINE MICROSCOPIC-ADD ON

## 2014-08-16 LAB — LIPASE, BLOOD: LIPASE: 20 U/L (ref 11–59)

## 2014-08-16 LAB — POC URINE PREG, ED: Preg Test, Ur: NEGATIVE

## 2014-08-16 MED ORDER — IOHEXOL 300 MG/ML  SOLN: 50.0000 mL | Freq: Once | INTRAMUSCULAR | Status: DC | PRN

## 2014-08-16 MED ORDER — IOHEXOL 300 MG/ML  SOLN
100.0000 mL | Freq: Once | INTRAMUSCULAR | Status: AC | PRN
  Administered 2014-08-16: 100 mL via INTRAVENOUS

## 2014-08-16 MED ORDER — HYDROCODONE-ACETAMINOPHEN 5-325 MG PO TABS: 1.0000 | ORAL_TABLET | Freq: Four times a day (QID) | ORAL | Status: AC | PRN

## 2014-08-16 MED ORDER — MORPHINE SULFATE 4 MG/ML IJ SOLN
6.0000 mg | Freq: Once | INTRAMUSCULAR | Status: AC
  Administered 2014-08-16: 6 mg via INTRAVENOUS
  Filled 2014-08-16: qty 2

## 2014-08-16 NOTE — Discharge Instructions (Signed)
Abdominal Pain, Women °Abdominal (stomach, pelvic, or belly) pain can be caused by many things. It is important to tell your doctor: °· The location of the pain. °· Does it come and go or is it present all the time? °· Are there things that start the pain (eating certain foods, exercise)? °· Are there other symptoms associated with the pain (fever, nausea, vomiting, diarrhea)? °All of this is helpful to know when trying to find the cause of the pain. °CAUSES  °· Stomach: virus or bacteria infection, or ulcer. °· Intestine: appendicitis (inflamed appendix), regional ileitis (Crohn's disease), ulcerative colitis (inflamed colon), irritable bowel syndrome, diverticulitis (inflamed diverticulum of the colon), or cancer of the stomach or intestine. °· Gallbladder disease or stones in the gallbladder. °· Kidney disease, kidney stones, or infection. °· Pancreas infection or cancer. °· Fibromyalgia (pain disorder). °· Diseases of the female organs: °¨ Uterus: fibroid (non-cancerous) tumors or infection. °¨ Fallopian tubes: infection or tubal pregnancy. °¨ Ovary: cysts or tumors. °¨ Pelvic adhesions (scar tissue). °¨ Endometriosis (uterus lining tissue growing in the pelvis and on the pelvic organs). °¨ Pelvic congestion syndrome (female organs filling up with blood just before the menstrual period). °¨ Pain with the menstrual period. °¨ Pain with ovulation (producing an egg). °¨ Pain with an IUD (intrauterine device, birth control) in the uterus. °¨ Cancer of the female organs. °· Functional pain (pain not caused by a disease, may improve without treatment). °· Psychological pain. °· Depression. °DIAGNOSIS  °Your doctor will decide the seriousness of your pain by doing an examination. °· Blood tests. °· X-rays. °· Ultrasound. °· CT scan (computed tomography, special type of X-ray). °· MRI (magnetic resonance imaging). °· Cultures, for infection. °· Barium enema (dye inserted in the large intestine, to better view it with  X-rays). °· Colonoscopy (looking in intestine with a lighted tube). °· Laparoscopy (minor surgery, looking in abdomen with a lighted tube). °· Major abdominal exploratory surgery (looking in abdomen with a large incision). °TREATMENT  °The treatment will depend on the cause of the pain.  °· Many cases can be observed and treated at home. °· Over-the-counter medicines recommended by your caregiver. °· Prescription medicine. °· Antibiotics, for infection. °· Birth control pills, for painful periods or for ovulation pain. °· Hormone treatment, for endometriosis. °· Nerve blocking injections. °· Physical therapy. °· Antidepressants. °· Counseling with a psychologist or psychiatrist. °· Minor or major surgery. °HOME CARE INSTRUCTIONS  °· Do not take laxatives, unless directed by your caregiver. °· Take over-the-counter pain medicine only if ordered by your caregiver. Do not take aspirin because it can cause an upset stomach or bleeding. °· Try a clear liquid diet (broth or water) as ordered by your caregiver. Slowly move to a bland diet, as tolerated, if the pain is related to the stomach or intestine. °· Have a thermometer and take your temperature several times a day, and record it. °· Bed rest and sleep, if it helps the pain. °· Avoid sexual intercourse, if it causes pain. °· Avoid stressful situations. °· Keep your follow-up appointments and tests, as your caregiver orders. °· If the pain does not go away with medicine or surgery, you may try: °¨ Acupuncture. °¨ Relaxation exercises (yoga, meditation). °¨ Group therapy. °¨ Counseling. °SEEK MEDICAL CARE IF:  °· You notice certain foods cause stomach pain. °· Your home care treatment is not helping your pain. °· You need stronger pain medicine. °· You want your IUD removed. °· You feel faint or   lightheaded. °· You develop nausea and vomiting. °· You develop a rash. °· You are having side effects or an allergy to your medicine. °SEEK IMMEDIATE MEDICAL CARE IF:  °· Your  pain does not go away or gets worse. °· You have a fever. °· Your pain is felt only in portions of the abdomen. The right side could possibly be appendicitis. The left lower portion of the abdomen could be colitis or diverticulitis. °· You are passing blood in your stools (bright red or black tarry stools, with or without vomiting). °· You have blood in your urine. °· You develop chills, with or without a fever. °· You pass out. °MAKE SURE YOU:  °· Understand these instructions. °· Will watch your condition. °· Will get help right away if you are not doing well or get worse. °Document Released: 08/15/2007 Document Revised: 03/04/2014 Document Reviewed: 09/04/2009 °[employer]® Patient Information ©2015 [employer]. This information is not intended to replace advice given to you by your health care provider. Make sure you discuss any questions you have with your health care provider. ° °

## 2014-08-16 NOTE — ED Notes (Signed)
While starting PIV pt is requesting either 16 or 18 "size needle because that's the smallest size for my veins". Explained to pt that 16 and 18 gauges are actually large bore catheters and that it was not necessary for her to have that size. Pt is also requesting that her PIV is placed in radial vein which was not easy to palpate. [facility], however she later complained to NT that IV was not placed correctly.

## 2014-08-16 NOTE — ED Notes (Addendum)
Pt reports intermittent chest pain. Current episode of L sided chest pain began yesterday ago. Reports dizziness and light headedness. Also reports abd pain with vomiting. Had abd surgery for incarcerated hernia a month ago. No swelling at incision site. Has black diarrhea multiple times a day, has emesis 2x day. Reports fever yesterday

## 2014-08-16 NOTE — ED Notes (Signed)
Pt observed talking on the phone saying "they want to check my rectal temperature but I'm not letting anyone put anything in my butt". Explained to pt that rectal temp.is most accurate and she sts she will think about it.

## 2014-08-16 NOTE — ED Provider Notes (Signed)
CSN: [id number]     Arrival date & time 08/16/14  1051 History   First MD Initiated Contact with Patient 08/16/14 1057     Chief Complaint  Patient presents with  . Chest Pain     (Consider location/radiation/quality/duration/timing/severity/associated sxs/prior Treatment) Patient is a 43 y.o. female presenting with chest pain. The history is provided by the patient.  Chest Pain Pain location:  L chest Pain quality comment:  Squeezing Pain radiates to:  L arm Pain radiates to the back: no   Pain severity:  Moderate Onset quality:  Sudden Duration:  10 minutes Timing:  Constant Progression:  Unchanged Chronicity:  New Context: not breathing, not eating, not raising an arm, not at rest, no stress and no trauma   Context comment:  Standing up Relieved by:  Nothing Worsened by:  Nothing tried Associated symptoms: abdominal pain, shortness of breath and vomiting   Associated symptoms: no anxiety, no back pain, no cough and no fever     Past Medical History  Diagnosis Date  . Umbilical hernia   . Hypertension   . Bipolar disorder   . Scoliosis   . OA (osteoarthritis) of knee   . Abdominal pain   . Leg swelling   . Constipation   . Weakness   . Easy bruising   . Generalized headaches     migraine headaches  . GERD (gastroesophageal reflux disease)   . Anemia   . Diabetes mellitus     diet controlled  . Difficulty sleeping   . Autoimmune disease    Past Surgical History  Procedure Laterality Date  . Hernia mesh removal    . Abdominal hysterectomy    . Knee surgery    . Foot surgery    . Bone spur      rt foot  . Hernia repair    . Ventral hernia repair N/A 07/05/2014    Procedure: LAPRASCOPIC VENTRAL HERNIA REPAIR WITH MESH;  Surgeon: Wenda LowMatt Martin, MD;  Location: [facility];  Service: General;  Laterality: N/A;  . Insertion of mesh  07/05/2014    Procedure: INSERTION OF MESH;  Surgeon: Wenda LowMatt Martin, MD;  Location: [facility];  Service: General;;   History reviewed. No  pertinent family history. History  Substance Use Topics  . Smoking status: Former Smoker    Quit date: 06/29/2011  . Smokeless tobacco: Not on file  . Alcohol Use: No   OB History   Grav Para Term Preterm Abortions TAB SAB Ect Mult Living                 Review of Systems  Constitutional: Negative for fever and chills.  Respiratory: Positive for shortness of breath. Negative for cough.   Cardiovascular: Positive for chest pain. Negative for leg swelling.  Gastrointestinal: Positive for vomiting, abdominal pain and diarrhea (black).  Musculoskeletal: Negative for back pain.  All other systems reviewed and are negative.     Allergies  Aspirin; Celebrex; Ibuprofen; Keflex; Penicillins; Polyurethane; and Ultram  Home Medications   Prior to Admission medications   Medication Sig Start Date End Date Taking? Authorizing Provider  ferrous sulfate 325 (65 FE) MG tablet Take 325 mg by mouth 2 (two) times daily with a meal.   Yes Historical Provider, MD  hydrOXYzine (ATARAX/VISTARIL) 25 MG tablet Take 25 mg by mouth at bedtime as needed (Sleep).    Yes Historical Provider, MD  lisinopril (PRINIVIL,ZESTRIL) 10 MG tablet Take 10 mg by mouth every morning.    Yes  Historical Provider, MD  omeprazole (PRILOSEC) 20 MG capsule Take 20 mg by mouth daily before breakfast.   Yes Historical Provider, MD  oxyCODONE-acetaminophen (PERCOCET) 7.5-325 MG per tablet Take 1 tablet by mouth every 4 (four) hours as needed for pain.   Yes Historical Provider, MD   BP 115/82  Pulse 72  Temp(Src) 98.2 F (36.8 C) (Oral)  Resp 16  SpO2 100% Physical Exam  Nursing note and vitals reviewed. Constitutional: She is oriented to person, place, and time. She appears well-developed and well-nourished. No distress.  HENT:  Head: Normocephalic and atraumatic.  Mouth/Throat: Oropharynx is clear and moist.  Eyes: EOM are normal. Pupils are equal, round, and reactive to light.  Neck: Normal range of motion. Neck  supple.  Cardiovascular: Normal rate and regular rhythm.  Exam reveals no friction rub.   No murmur heard. Pulmonary/Chest: Effort normal and breath sounds normal. No respiratory distress. She has no wheezes. She has no rales.  Abdominal: Soft. She exhibits no distension. There is tenderness (LLQ). There is guarding (LLQ). There is no rebound.  Musculoskeletal: Normal range of motion. She exhibits no edema.  Neurological: She is alert and oriented to person, place, and time.  Skin: No rash noted. She is not diaphoretic.    ED Course  Procedures (including critical care time) Labs Review Labs Reviewed  CBC  COMPREHENSIVE METABOLIC PANEL  URINALYSIS, ROUTINE W REFLEX MICROSCOPIC  LIPASE, BLOOD  I-STAT TROPOININ, ED    Imaging Review No results found. CT Abdomen Pelvis W Contrast (Final result)  Result time: 08/16/14 14:13:00    Final result by Rad Results In Interface (08/16/14 14:13:00)    Narrative:   CLINICAL DATA: Intermittent pain. Vomiting.Diarrhea. Hernia surgery 1 month ago.  EXAM: CT ABDOMEN AND PELVIS WITH CONTRAST  TECHNIQUE: Multidetector CT imaging of the abdomen and pelvis was performed using the standard protocol following bolus administration of intravenous contrast.  CONTRAST: 100mL OMNIPAQUE IOHEXOL 300 MG/ML SOLN  COMPARISON: CT 07/09/2014.  FINDINGS: Liver normal. Spleen normal. Pancreas normal. No biliary distention. The gallbladder is nondistended.  Adrenals normal. No focal renal abnormality. No significant hydronephrosis. No obstructing ureteral stone. 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 Bladder is nondistended. Hysterectomy. 1.8 cm simple left ovarian cyst. No pathologic pelvic fluid collection. Phleboliths.  No significant adenopathy. Abdominal aorta slightly patent. Visceral vessels are patent. Portal vein patent.  Appendix normal. No bowel distention. No free air. Postsurgical changes from prior ventral hernia repair, no  complicating features noted. No evidence of bowel herniation.  Mild basilar atelectasis. Heart size normal. No acute bony abnormality.  IMPRESSION: 1. No acute abnormality. 2. Prior ventral hernia repair. No complicating features.   Electronically Signed By: Maisie Fushomas Register On: 08/16/2014 14:13             DG Chest 2 View (Final result)  Result time: 08/16/14 13:03:38    Final result by Rad Results In Interface (08/16/14 13:03:38)    Narrative:   CLINICAL DATA: Sore throat, cough, short of breath. Initial counter  EXAM: CHEST 2 VIEW  COMPARISON: 06/28/2014  FINDINGS: Normal mediastinum and cardiac silhouette. Normal pulmonary vasculature. No evidence of effusion, infiltrate, or pneumothorax. No acute bony abnormality.  IMPRESSION: No acute cardiopulmonary process.   Electronically Signed By: Genevive BiStewart Edmunds M.D. On: 08/16/2014 13:03             EKG Interpretation   Date/Time:  Friday August 16 2014 11:01:54 EDT Ventricular Rate:  70 PR Interval:  157 QRS Duration: 88  QT Interval:  376 QTC Calculation: 406 R Axis:   83 Text Interpretation:  Sinus rhythm Low voltage, precordial leads Similar  to prior Confirmed by Gwendolyn Grant  MD, Jamari Moten ([id number]) on 08/16/2014 11:07:15 AM      MDM   Final diagnoses:  Left lower quadrant pain    37F presents with multiple complaints. Chest pain - Present since yesterday, intermittent, lasting minutes at a time. Happened when she stood up, she had gripping like chest pain with radiation of sharp pain down her arm. Lasted 5-10 minutes with associated dizziness. No hx of cardiac issues. Initial EKG ok, will check labs. Abdominal pain - present since surgery for incarcerated hernia. Patient complaining of LLQ due to this surgery. Abdomen with severe LLQ pain. Denies urinary or vaginal complaints. Will scan.  Scan without any acute problems. I spoke with Dr. Daphine Deutscher with surgery who knows patient well, states this is common  for her, states he's prescribed pain meds for her but she legitimately has issues with transportation to get meds.   Labs ok. Feeling better after meds. Scan ok. Patient stable for discharge.  Elwin Mocha, MD 08/20/14 [phone]

## 2014-08-16 NOTE — Telephone Encounter (Signed)
Pt called today to report [facility].  She asked to speak to Dr. Daphine DeutscherMartin.  I informed the patient he was not in the office today but would let him and his nurse know she was at the ED.  Message in [employer].

## 2014-08-16 NOTE — ED Notes (Signed)
Patient transported to CT 

## 2014-08-17 ENCOUNTER — Telehealth (INDEPENDENT_AMBULATORY_CARE_PROVIDER_SITE_OTHER): Payer: Self-pay | Admitting: General Surgery

## 2014-08-17 NOTE — Telephone Encounter (Signed)
Patient called states she is having pain in her abdomen and also headache and chest pain unrelieved with Vicodin. She was in the emergency department yesterday with a fairly extensive negative workup including negative EKG and CT scan and lab. We reviewed these results. She feels she is having an allergic reaction to the mesh causing the pain. I told her if her pain was worsening or not tolerable as it is the weekend she would need to return to the emergency department. If she is getting by she will contact Dr. Daphine DeutscherMartin next week to see what the next episode.

## 2014-10-23 ENCOUNTER — Telehealth (INDEPENDENT_AMBULATORY_CARE_PROVIDER_SITE_OTHER): Payer: Self-pay

## 2014-10-23 NOTE — Telephone Encounter (Signed)
return call to patient- patient calling in to report that she's having dark, tarry stools.  Patient s/p Hernia Repair on 07/05/14.  Patient denies having any fevers, nausea or vomiting.  Patient reports that her abdomen is slightly bloated and she has some abdominal discomfort.  Patient sttates she's tried Miralax and Enema with no relief.  Patient reports drinking plenty ofr fluids.  Patient states she has very little appetite.  Patient reports that she takes an Iron Supplement for Anemia.  Patient advised that Iron Supplement may cause dark, tarry stools.  Patient concerned there may be something serious going on with her stomach such as internal bleeding or incarcerated hernia.  I strongly advised patient to go to the ER for further medical assessment.  [place].  Advised patient to go to the nearest ER for further evaluation.  Patient verbalized understanding and agrees with plan as above.

## 2014-10-27 ENCOUNTER — Telehealth (INDEPENDENT_AMBULATORY_CARE_PROVIDER_SITE_OTHER): Payer: Self-pay | Admitting: Surgery

## 2014-10-27 NOTE — Telephone Encounter (Signed)
Jennifer Barajas  10/15/1971 [id number]  Patient Care Team: Lizbeth Barkeborah N Smothers, NP as PCP - General (Nurse Practitioner)  This patient is a 43 y.o.female who calls today for surgical evaluation.    Surgery:   DATE OF PROCEDURE: 07/05/2014   OPERATIVE REPORT  PREOPERATIVE DIAGNOSIS: Thrice recurrent periumbilical hernia.  POSTOPERATIVE DIAGNOSIS: Thrice recurrent periumbilical hernia.   SURGEON: Thornton ParkMatthew B. Daphine DeutscherMartin, M.D.   PROCEDURES: Laparoscopic ventral hernia repair using a 12-cm round  Parietex mesh patch, anchored with permanent sutures in the absorbable  ReliaTack.   Reason for call: Complaints  The patient calls again.  She calls Sunday night at 10pm.  She is a woman who claims to have a hernia repair in 2009 in [place].  Sounds like mesh had to be removed.  She recalls that she had a "allergy" to it.  Had another repair and a local home looks thousand 14.  Relocated here.  Eventually had surgical per by Dr. Daphine DeutscherMartin with Parietex mesh.  She has struggled with complaints of abdominal pain and dark stools for the past 4 months intermittent.  She has missed appointments in the office at times due to difficulty obtaining [place]  Last office 3 weeks post op in September.  She insists that she saw us in October.  Appointment offered in mid October but no evidence that she actually came.  However, decision made that patient would benefit from consultation with gastroenterology to evaluate abdominal pain and possible rectal bleeding.  She was discussed with her primary care physician.  She has not seen a gastroenterologist.  She notes that sometimes she will not have a bowel movement for 2 weeks.  Then drinks chocolate milk and can move her bowels.  She claims she has tried [place] lax and Metamucil and they do not work.  She claims that she is taking omeprazole once a day.  She denies taking any aspirin or nonsteroidals.  She is not on any  anticoagulation/blood thinners.  She is eating fine.  No nausea or vomiting.  In the past, she has had complaints of sharp abdominal pain and sharp chest pain.  She did not mention it on this call.  In the past she has complained of lightheadedness and dizziness.  She did not bring it up with this telephone call.  She again brings up her concerns that she is allergic to the mesh or may be rejecting it.  She denies any redness or swelling at her surgery sites in her abdomen.  She thinks that there is a new knot.  It is not hot.  She is not have any drainage.  I went over her CT scan results 5 weeks postoperatively in mid October which showed no evidence of any seroma or hematoma.  No evidence of inflammation.  No evidence of recurrent hernia.  I tried to reassure her that it would not be too likely for mesh infection 4-1/2 months out from surgery.  However, I noted it is reasonable to be evaluated in the office to help see her and see if there are concerns on exam.  She sounds annoyed but calm.  Sounds like someone else was talking to her (husband?), offering advice on what to say on the phone - maybe it was an echo on the cell phone.    I strongly recommended that she obtain an appointment with a gastroenterologist.  I explained the importance of this that she may require endoscopy to evaluate for possible sources of  bleeding.  I noted that because she is having pain despite being on the omeprazole and avoiding nonsteroidals, something else may be going on.  I noted if she does not have tolerance to any fiber supplement or bowel regimen, she may benefit from further evaluation or medications to help sort out her irregular bowels.  I did not hear that she was going to do that.  She claimed she had problems getting transportation to do that.  I noted if her symptoms are unbearable, she can go to the emergency room to be evaluated,   I noted she had called a few days ago and this was recommended.  I asked if  she had taken that advice and went to an emergency room.  She claims she could not drive to an emergency room.  I tried to validate that she has reasonable concerns but it is difficult to help her when she goes against medical advice.  She has nor seen a gastroenterologist.  She has missed appointments offered at our office.  I noted is hard to evaluate and treat problems over the phone on a weekend but asked her consider the advice she had been recommended. She then hung up on me.  Patient Active Problem List   Diagnosis Date Noted  . Obesity (BMI 30-39.9) 07/06/2014  . Avitaminosis D 05/15/2014  . Diabetes 05/10/2014  . recurrent ventral hernia 05/10/2014  . Acid reflux 05/07/2014  . ANA positive 05/07/2014  . Leg paresthesia 05/01/2014  . Ankle pain 05/01/2014  . Anxiety 04/03/2014    Past Medical History  Diagnosis Date  . Umbilical hernia   . Hypertension   . Bipolar disorder   . Scoliosis   . OA (osteoarthritis) of knee   . Abdominal pain   . Leg swelling   . Constipation   . Weakness   . Easy bruising   . Generalized headaches     migraine headaches  . GERD (gastroesophageal reflux disease)   . Anemia   . Diabetes mellitus     diet controlled  . Difficulty sleeping   . Autoimmune disease     Past Surgical History  Procedure Laterality Date  . Hernia mesh removal    . Abdominal hysterectomy    . Knee surgery    . Foot surgery    . Bone spur      rt foot  . Hernia repair    . Ventral hernia repair N/A 07/05/2014    Procedure: LAPRASCOPIC VENTRAL HERNIA REPAIR WITH MESH;  Surgeon: Wenda LowMatt Martin, MD;  Location: [facility];  Service: General;  Laterality: N/A;  . Insertion of mesh  07/05/2014    Procedure: INSERTION OF MESH;  Surgeon: Wenda LowMatt Martin, MD;  Location: [facility];  Service: General;;    History   Social History  . Marital Status: Married    Spouse Name: N/A    Number of Children: N/A  . Years of Education: N/A   Occupational History  . Not on file.    Social History Main Topics  . Smoking status: Former Smoker    Quit date: 06/29/2011  . Smokeless tobacco: Not on file  . Alcohol Use: No  . Drug Use: No  . Sexual Activity: Not on file   Other Topics Concern  . Not on file   Social History Narrative  . No narrative on file    No family history on file.  Current Outpatient Prescriptions  Medication Sig Dispense Refill  . ferrous sulfate 325 (65  FE) MG tablet Take 325 mg by mouth 2 (two) times daily with a meal.    . HYDROcodone-acetaminophen (NORCO/VICODIN) 5-325 MG per tablet Take 1 tablet by mouth every 6 (six) hours as needed for moderate pain. 30 tablet 0  . hydrOXYzine (ATARAX/VISTARIL) 25 MG tablet Take 25 mg by mouth at bedtime as needed (Sleep).     Marland Kitchen lisinopril (PRINIVIL,ZESTRIL) 10 MG tablet Take 10 mg by mouth every morning.     Marland Kitchen omeprazole (PRILOSEC) 20 MG capsule Take 20 mg by mouth daily before breakfast.    . oxyCODONE-acetaminophen (PERCOCET) 7.5-325 MG per tablet Take 1 tablet by mouth every 4 (four) hours as needed for pain.     No current facility-administered medications for this visit.     Allergies  Allergen Reactions  . Aspirin Other (See Comments)    Anemia  . Celebrex [Celecoxib] Hives and Nausea And Vomiting  . Ibuprofen Other (See Comments)    Anemia  . Keflex [Cephalexin] Other (See Comments)    Tremors, jittery  . Penicillins Hives  . Polyurethane [Urethane] Nausea And Vomiting  . Ultram [Tramadol] Hives, Nausea And Vomiting and Rash    Results for RACHEL, RISON (MRN [id number]) as of 07/18/2014 18:38  Ref. Range 01/15/2014 01:00 05/16/2014 18:05 06/28/2014 14:15 07/09/2014 15:17 07/09/2014 15:38  WBC Latest Range: 4.0-10.5 K/uL 8.3 6.0 7.4 8.4   RBC Latest Range: 3.87-5.11 MIL/uL 4.20 4.15 4.46 4.09   Hemoglobin Latest Range: 12.0-15.0 g/dL 40.9 (L) 81.1 (L) 91.4 11.4 (L) 11.6 (L)  HCT Latest Range: 36.0-46.0 % 35.4 (L) 33.9 (L) 37.7 34.4 (L) 34.0 (L)  MCV Latest Range: 78.0-100.0 fL 84.3  81.7 84.5 84.1   MCH Latest Range: 26.0-34.0 pg 28.3 28.2 28.7 27.9   MCHC Latest Range: 30.0-36.0 g/dL 78.2 95.6 21.3 08.6   RDW Latest Range: 11.5-15.5 % 13.2 13.1 13.3 13.4   Platelets Latest Range: 150-400 K/uL 225 176 201 172     @VS @  Dg Chest 2 View  06/28/2014   CLINICAL DATA:  Preoperative evaluation.  EXAM: CHEST  2 VIEW  COMPARISON:  Chest radiograph 05/16/2014  FINDINGS: Normal cardiac and mediastinal contours. Lungs are clear. No pleural effusion or pneumothorax. Regional skeleton is unremarkable.  IMPRESSION: No acute cardiopulmonary process.   Electronically Signed   By: Annia Belt M.D.   On: 06/28/2014 14:34   Ct Abdomen Pelvis W Contrast  07/09/2014   CLINICAL DATA:  Left lower quadrant pain and bruising. Postop from hernia repair.  EXAM: CT ABDOMEN AND PELVIS WITH CONTRAST  TECHNIQUE: Multidetector CT imaging of the abdomen and pelvis was performed using the standard protocol following bolus administration of intravenous contrast.  CONTRAST:  OMNIPAQUE IOHEXOL 300 MG/ML  SOLN  COMPARISON:  05/16/2014  FINDINGS: Liver:  No masses or other significant abnormality identified.  Gallbladder/Biliary:  Unremarkable.  Pancreas: No mass, inflammatory changes, or other parenchymal abnormality identified.  Spleen:  Within normal limits in size and appearance.  Adrenal Glands:  No mass identified.  Kidneys/Urinary Tract: No masses identified. No evidence of hydronephrosis.  Lymph Nodes:  No pathologically enlarged lymph nodes identified.  Pelvic/Reproductive Organs: Prior hysterectomy noted. Adnexal regions are unremarkable in appearance.  Bowel/Peritoneum: No evidence of bowel wall thickening, mass, or obstruction.  Vascular:  No evidence of abdominal aortic aneurysm.  Musculoskeletal:  No suspicious bone lesions identified.  Other: Subcutaneous emphysema is seen in the lower anterior abdominal wall, however there is no evidence of hematoma, abscess, or recurrent hernia. Mild bibasilar  atelectasis noted.  IMPRESSION: Expected postoperative changes from recent ventral abdominal wall hernia repair. No evidence of hematoma, abscess, or other complication. No evidence of recurrent hernia.  Mild bibasilar atelectasis.   Electronically Signed   By: Myles Rosenthal M.D.   On: 07/09/2014 18:48    Note: This dictation was prepared with Dragon/digital dictation along with [employer]. Any transcriptional errors that result from this process are unintentional.

## 2014-10-28 ENCOUNTER — Other Ambulatory Visit (INDEPENDENT_AMBULATORY_CARE_PROVIDER_SITE_OTHER): Payer: Self-pay

## 2014-10-28 ENCOUNTER — Telehealth (INDEPENDENT_AMBULATORY_CARE_PROVIDER_SITE_OTHER): Payer: Self-pay

## 2014-10-28 NOTE — Telephone Encounter (Signed)
Patient calling into office requesting appointment with Dr. Daphine DeutscherMartin due to complaints of abdominal pain and dark stools.  Patient states "I will slap a lawsuit against your office and Dr. Daphine DeutscherMartin"  Advised patient that we have made every effort to schedule an office appointment with Dr. Daphine DeutscherMartin or other providers in the office to evaluate and assess her medical complaints and concerns but, she has missed the following appointments:  07/19/14 urgent office, 08/15/14 No Show, 09/13/14 cancelled, 09/25/14 cancelled.  Patient reports that she wasn't able to come to her appointments due to transportation issues.  Patient did come into the office on 07/25/14 and was seen by Dr. Daphine DeutscherMartin.  Our office referred patient to Gastroenterologist for further GI work up.  Patient brings up her concerns that she is allergic to the mesh or may be rejecting it. She denies any redness or swelling at her surgery sites in her abdomen.  Patient reports today that she's having nausea and has had some blood tinge vomiting.  Patient strongly advised to go the the nearest ER for medical assessment.  Patient reports that she cannot go because she does not have gas money or transportation to get back home from the ED.  Advised patient to call 911 and EMS will transport her to the nearest ER.   Patient verbalized understanding.  Patient given appointment for 11/18/14 with Dr. Daphine DeutscherMartin, this will allow patient to make arrangements for transportation. I explained to patient that it's hard to evaluate and treat problems over the phone and it's in her best interest to go to the ER for further medical assessment.

## 2015-01-23 IMAGING — CT CT ABD-PELV W/ CM
1 of 3 series · 14 of 32 positions shown, 19 images · IV contrast (OMNIPAQUE 300)
Comparison: CT 07/09/2014.

CLINICAL DATA: Intermittent pain. Vomiting.Diarrhea. Hernia surgery
1 month ago.

EXAM:
CT ABDOMEN AND PELVIS WITH CONTRAST
TECHNIQUE: Multidetector CT imaging of the abdomen and pelvis was performed
using the standard protocol following bolus administration of
intravenous contrast.
CONTRAST:  100mL OMNIPAQUE IOHEXOL 300 MG/ML  SOLN

[Series 2: abd/pel with · axial · 0.83mm/px · z∈[-428,+17]mm · 14 of 101 slices shown, 19 images]
[im 6/101  soft-tissue]
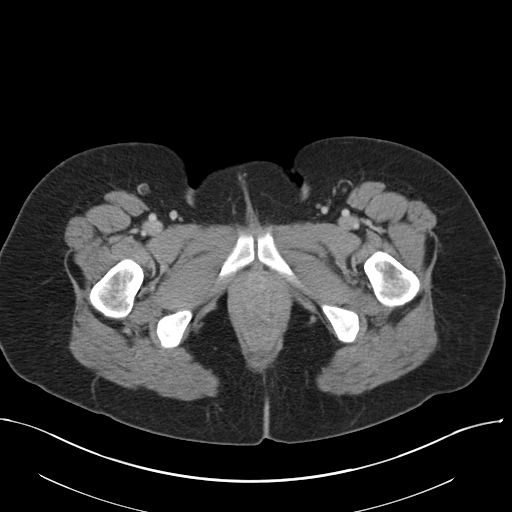
[im 6/101  bone]
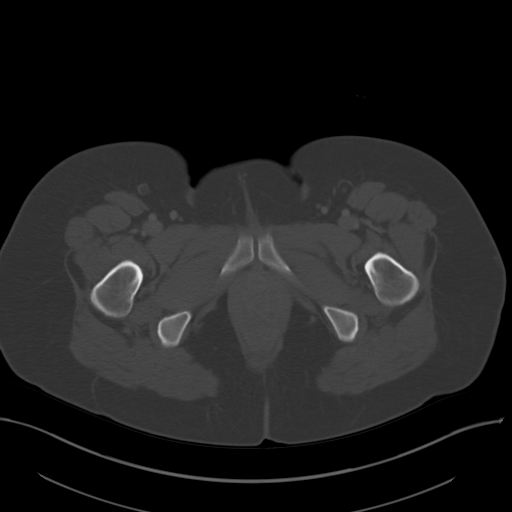
[im 16/101  soft-tissue]
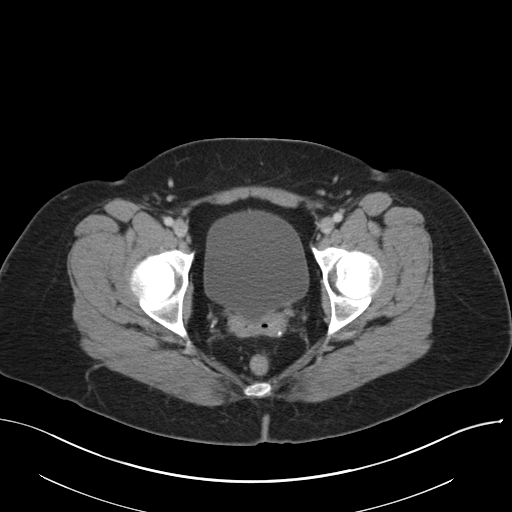
[im 22/101  soft-tissue]
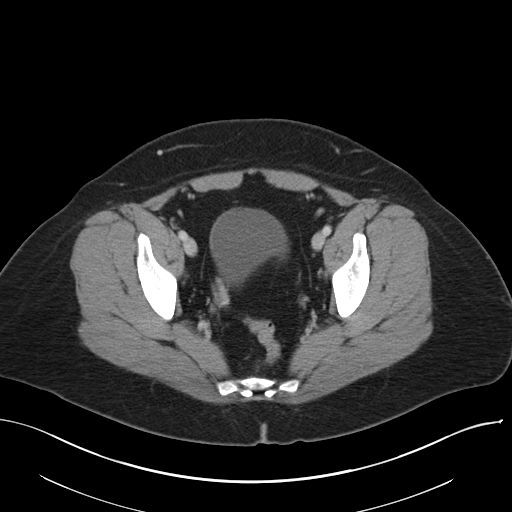
[im 27/101  soft-tissue]
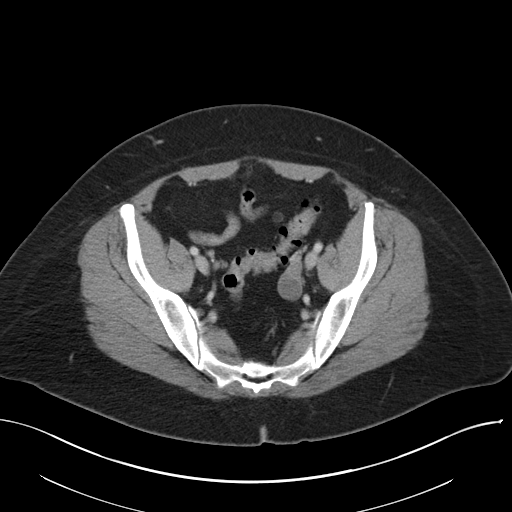
[im 37/101  soft-tissue]
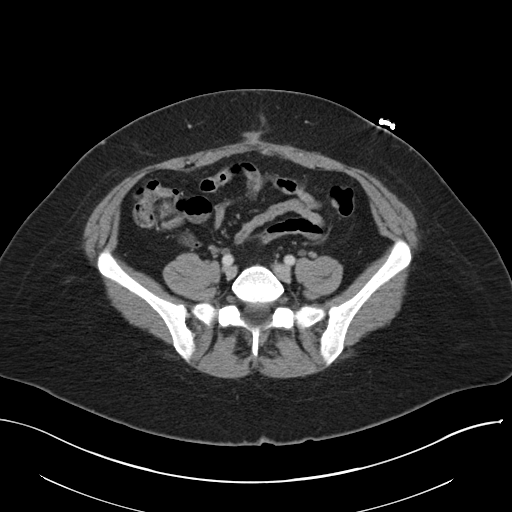
[im 43/101  soft-tissue]
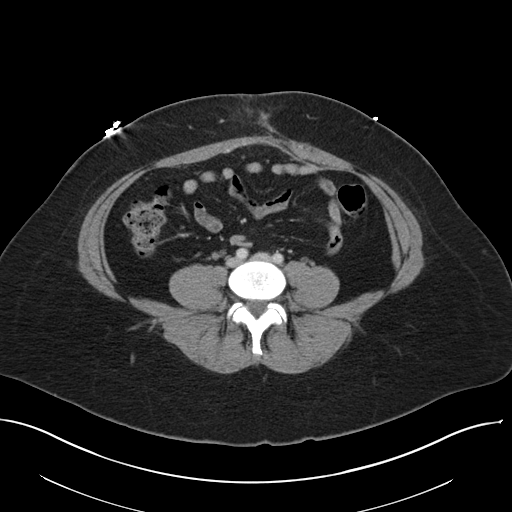
[im 53/101  soft-tissue]
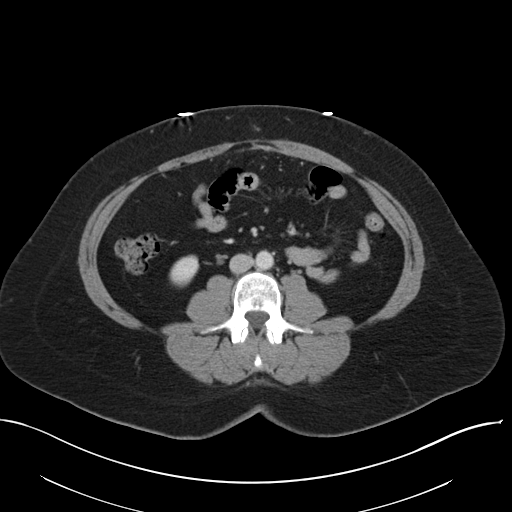
[im 58/101  soft-tissue]
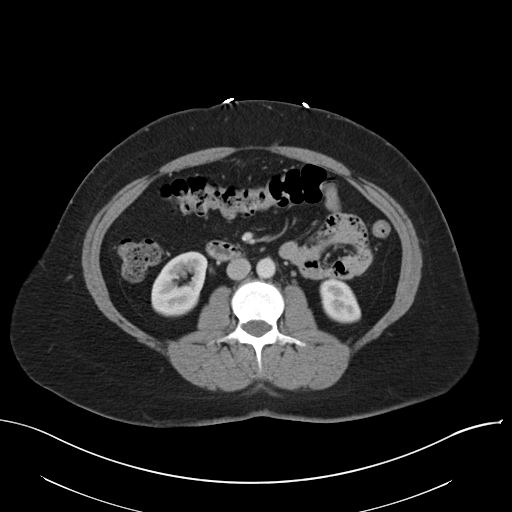
[im 64/101  soft-tissue]
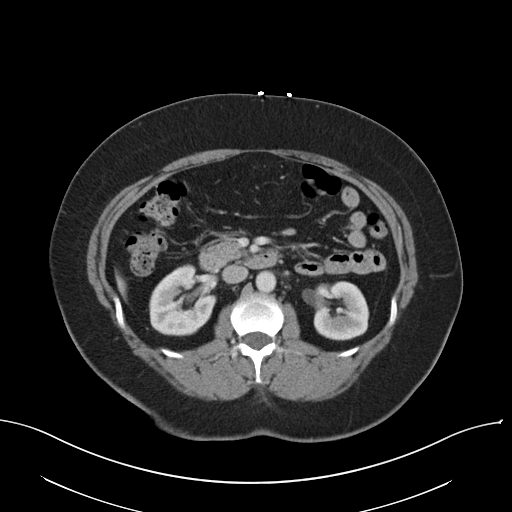
[im 64/101  bone]
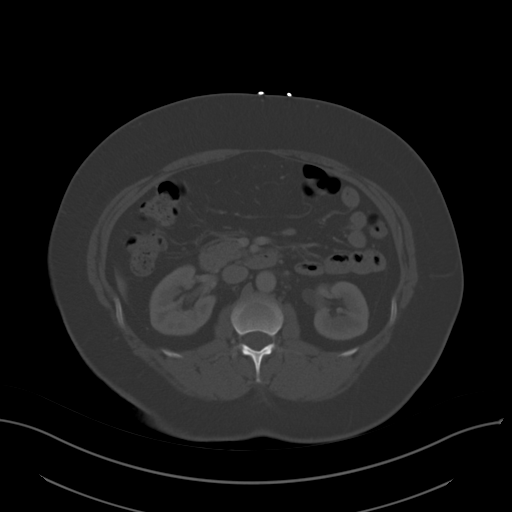
[im 74/101  soft-tissue]
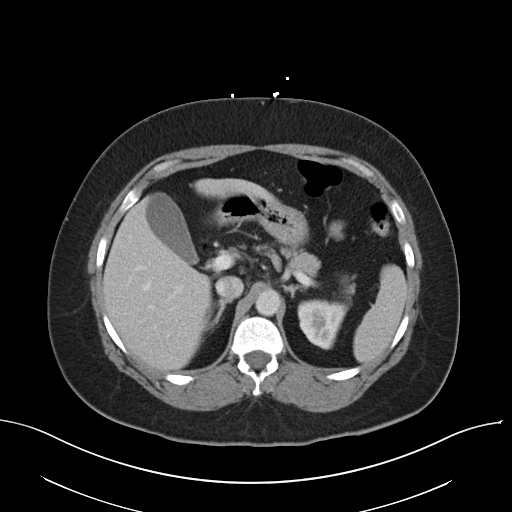
[im 79/101  soft-tissue]
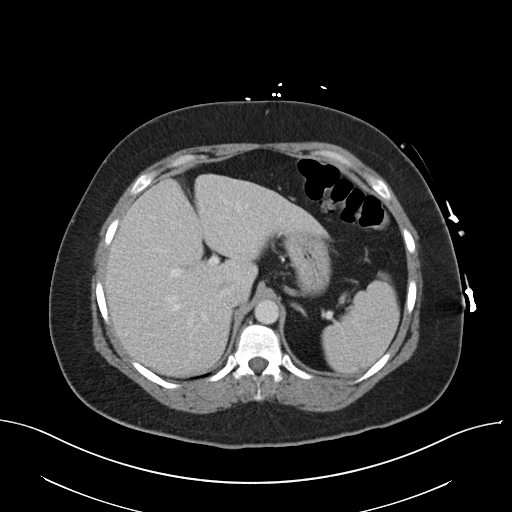
[im 79/101  lung]
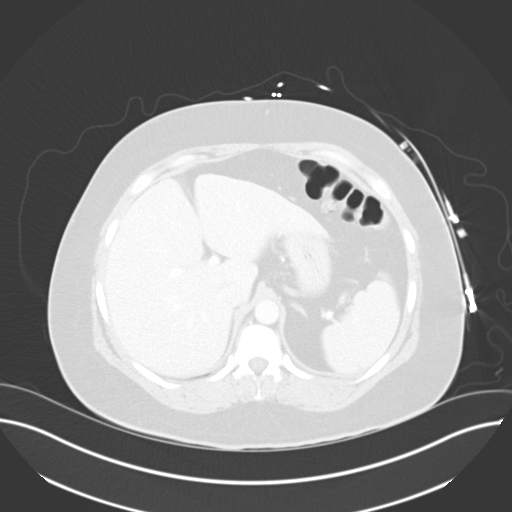
[im 85/101  soft-tissue]
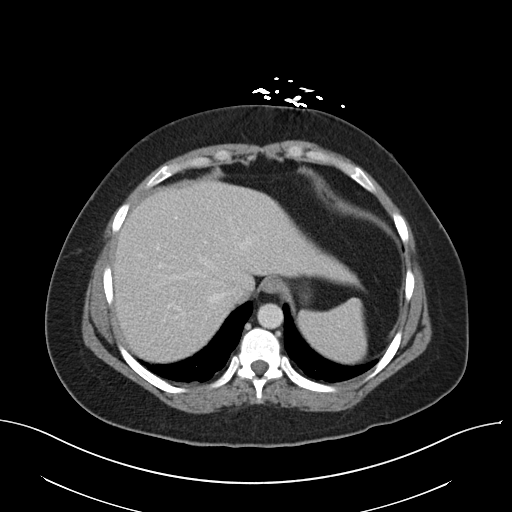
[im 85/101  lung]
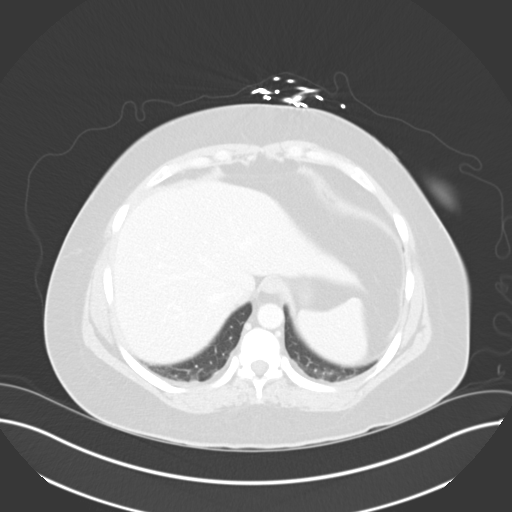
[im 90/101  lung]
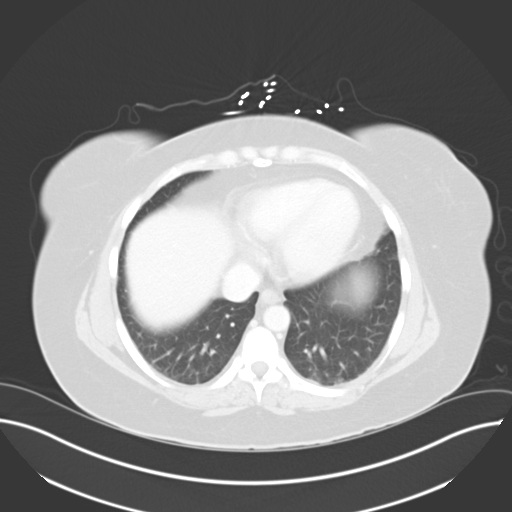
[im 95/101  soft-tissue]
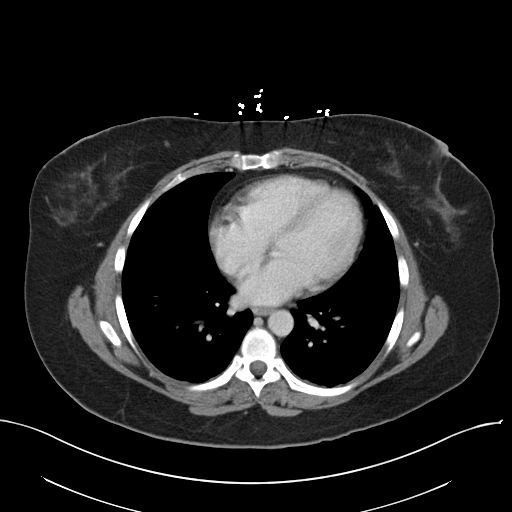
[im 95/101  lung]
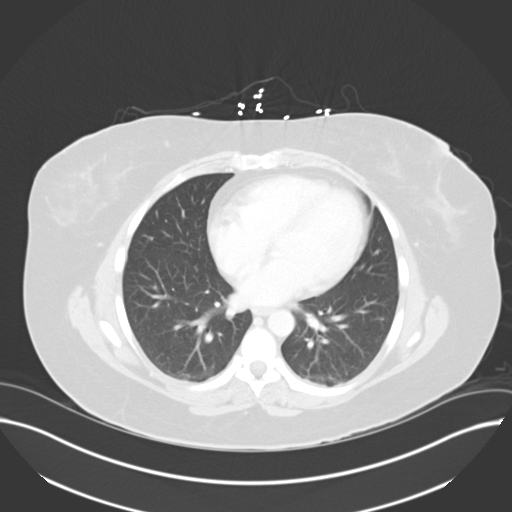

[14 of 32 positions shown; findings below may reference images not displayed]

FINDINGS: Liver normal. Spleen normal. Pancreas normal. No biliary distention.
The gallbladder is nondistended.

Adrenals normal. No focal renal abnormality. No significant
hydronephrosis. No obstructing ureteral stone. Extrarenal pelvis is
again noted on the right. Bladder is nondistended. Hysterectomy.
cm simple left ovarian cyst. No pathologic pelvic fluid collection.
Phleboliths.

No significant adenopathy. Abdominal aorta slightly patent. Visceral
vessels are patent. Portal vein patent.

Appendix normal. No bowel distention. No free air. Postsurgical
changes from prior ventral hernia repair, no complicating features
noted. No evidence of bowel herniation.

Mild basilar atelectasis. Heart size normal. No acute bony
abnormality.
IMPRESSION: 1. No acute abnormality.
2. Prior ventral hernia repair.  No complicating features.

## 2015-11-25 DIAGNOSIS — R0681 Apnea, not elsewhere classified: Secondary | ICD-10-CM | POA: Insufficient documentation

## 2015-11-25 DIAGNOSIS — R002 Palpitations: Secondary | ICD-10-CM | POA: Insufficient documentation

## 2015-11-25 DIAGNOSIS — R072 Precordial pain: Secondary | ICD-10-CM | POA: Insufficient documentation

## 2015-12-27 ENCOUNTER — Emergency Department: Payer: No Typology Code available for payment source

## 2015-12-27 ENCOUNTER — Encounter: Payer: Self-pay | Admitting: Emergency Medicine

## 2015-12-27 ENCOUNTER — Emergency Department
Admission: EM | Admit: 2015-12-27 | Discharge: 2015-12-27 | Disposition: A | Discharge status: Home / Self Care | Payer: No Typology Code available for payment source | Attending: Emergency Medicine | Admitting: Emergency Medicine

## 2015-12-27 DIAGNOSIS — Z880 Allergy status to penicillin: Secondary | ICD-10-CM | POA: Diagnosis not present

## 2015-12-27 DIAGNOSIS — Y9389 Activity, other specified: Secondary | ICD-10-CM | POA: Diagnosis not present

## 2015-12-27 DIAGNOSIS — I10 Essential (primary) hypertension: Secondary | ICD-10-CM | POA: Diagnosis not present

## 2015-12-27 DIAGNOSIS — E119 Type 2 diabetes mellitus without complications: Secondary | ICD-10-CM | POA: Insufficient documentation

## 2015-12-27 DIAGNOSIS — Y92512 Supermarket, store or market as the place of occurrence of the external cause: Secondary | ICD-10-CM | POA: Diagnosis not present

## 2015-12-27 DIAGNOSIS — S0990XA Unspecified injury of head, initial encounter: Secondary | ICD-10-CM | POA: Diagnosis not present

## 2015-12-27 DIAGNOSIS — Z79899 Other long term (current) drug therapy: Secondary | ICD-10-CM | POA: Insufficient documentation

## 2015-12-27 DIAGNOSIS — Y998 Other external cause status: Secondary | ICD-10-CM | POA: Diagnosis not present

## 2015-12-27 DIAGNOSIS — G8929 Other chronic pain: Secondary | ICD-10-CM | POA: Diagnosis not present

## 2015-12-27 DIAGNOSIS — S3992XA Unspecified injury of lower back, initial encounter: Secondary | ICD-10-CM | POA: Insufficient documentation

## 2015-12-27 HISTORY — DX: Polyneuropathy, unspecified: G62.9

## 2015-12-27 HISTORY — DX: Sciatica, unspecified side: M54.30

## 2015-12-27 LAB — URINALYSIS COMPLETE WITH MICROSCOPIC (ARMC ONLY)
BACTERIA UA: NONE SEEN
Bilirubin Urine: NEGATIVE
Glucose, UA: NEGATIVE mg/dL
Ketones, ur: NEGATIVE mg/dL
NITRITE: NEGATIVE
PROTEIN: NEGATIVE mg/dL
SPECIFIC GRAVITY, URINE: 1.017 (ref 1.005–1.030)
pH: 5 (ref 5.0–8.0)

## 2015-12-27 LAB — COMPREHENSIVE METABOLIC PANEL
ALT: 26 U/L (ref 14–54)
ANION GAP: 8 (ref 5–15)
AST: 22 U/L (ref 15–41)
Albumin: 4.3 g/dL (ref 3.5–5.0)
Alkaline Phosphatase: 59 U/L (ref 38–126)
BUN: 11 mg/dL (ref 6–20)
CHLORIDE: 102 mmol/L (ref 101–111)
CO2: 25 mmol/L (ref 22–32)
Calcium: 9.2 mg/dL (ref 8.9–10.3)
Creatinine, Ser: 0.79 mg/dL (ref 0.44–1.00)
GFR calc Af Amer: >60 mL/min (ref >60)
GFR calc non Af Amer: >60 mL/min (ref >60)
Glucose, Bld: 111 mg/dL — ABNORMAL HIGH (ref 65–99)
Potassium: 3.6 mmol/L (ref 3.5–5.1)
SODIUM: 135 mmol/L (ref 135–145)
Total Bilirubin: 0.4 mg/dL (ref 0.3–1.2)
Total Protein: 7.3 g/dL (ref 6.5–8.1)

## 2015-12-27 LAB — CBC
HEMATOCRIT: 35.7 % (ref 35.0–47.0)
HEMOGLOBIN: 12.3 g/dL (ref 12.0–16.0)
MCH: 28.9 pg (ref 26.0–34.0)
MCHC: 34.5 g/dL (ref 32.0–36.0)
MCV: 83.7 fL (ref 80.0–100.0)
Platelets: 203 10*3/uL (ref 150–440)
RBC: 4.26 MIL/uL (ref 3.80–5.20)
RDW: 13.6 % (ref 11.5–14.5)
WBC: 7.8 10*3/uL (ref 3.6–11.0)

## 2015-12-27 LAB — LIPASE, BLOOD: Lipase: 29 U/L (ref 11–51)

## 2015-12-27 MED ORDER — OXYCODONE-ACETAMINOPHEN 5-325 MG PO TABS: 1.0000 | ORAL_TABLET | Freq: Once | ORAL | Status: DC
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
## 2015-12-27 NOTE — Discharge Instructions (Signed)

## 2015-12-28 LAB — URINE CULTURE

## 2016-01-01 ENCOUNTER — Encounter: Payer: Self-pay | Admitting: General Surgery

## 2016-01-01 NOTE — Progress Notes (Unsigned)
She called stating she felt she was having an allergic reaction to the mesh used by Dr. Daphine Deutscher to repair a recurrent umbilical hernia September of 2015.  She states she is having nausea, vomiting, and diarrhea which she states is similar to another time she rejected mesh.  She recently moved back to [place] from [place] and states she does not have health insurance.  I instructed her to call the office tomorrow and get an appointment with Dr. Daphine Deutscher.

## 2016-02-08 ENCOUNTER — Emergency Department: Payer: No Typology Code available for payment source

## 2016-02-08 ENCOUNTER — Emergency Department
Admission: EM | Admit: 2016-02-08 | Discharge: 2016-02-08 | Disposition: A | Discharge status: Home / Self Care | Payer: No Typology Code available for payment source | Attending: Emergency Medicine | Admitting: Emergency Medicine

## 2016-02-08 DIAGNOSIS — Z79899 Other long term (current) drug therapy: Secondary | ICD-10-CM | POA: Insufficient documentation

## 2016-02-08 DIAGNOSIS — K219 Gastro-esophageal reflux disease without esophagitis: Secondary | ICD-10-CM | POA: Insufficient documentation

## 2016-02-08 DIAGNOSIS — F319 Bipolar disorder, unspecified: Secondary | ICD-10-CM | POA: Insufficient documentation

## 2016-02-08 DIAGNOSIS — G629 Polyneuropathy, unspecified: Secondary | ICD-10-CM | POA: Insufficient documentation

## 2016-02-08 DIAGNOSIS — I10 Essential (primary) hypertension: Secondary | ICD-10-CM | POA: Insufficient documentation

## 2016-02-08 DIAGNOSIS — E669 Obesity, unspecified: Secondary | ICD-10-CM | POA: Insufficient documentation

## 2016-02-08 DIAGNOSIS — M419 Scoliosis, unspecified: Secondary | ICD-10-CM | POA: Insufficient documentation

## 2016-02-08 DIAGNOSIS — M79671 Pain in right foot: Secondary | ICD-10-CM | POA: Insufficient documentation

## 2016-02-08 DIAGNOSIS — E119 Type 2 diabetes mellitus without complications: Secondary | ICD-10-CM | POA: Insufficient documentation

## 2016-02-08 MED ORDER — OXYCODONE-ACETAMINOPHEN 5-325 MG PO TABS
2.0000 | ORAL_TABLET | Freq: Once | ORAL | Status: AC
  Administered 2016-02-08: 2 via ORAL

## 2016-02-08 MED ORDER — OXYCODONE-ACETAMINOPHEN 5-325 MG PO TABS
ORAL_TABLET | ORAL | Status: AC
  Administered 2016-02-08: 2 via ORAL
  Filled 2016-02-08: qty 2

## 2016-02-08 NOTE — ED Provider Notes (Signed)
[facility] Emergency Department Provider Note  ____________________________________________  Time seen: Approximately 2:58 AM  I have reviewed the triage vital signs and the nursing notes.   HISTORY  Chief Complaint Foot Pain    HPI Jennifer Barajas is a 45 y.o. female with multiple chronic medical issues who presents with acute onset of pain in her right foot.  She reports that she has had pain constantly for about 6 months since an injury in September.  She reports that the pain is constant and became acutely worse about a week ago when she was walking and heard a pop in her foot.  She is able to ambulate and bear weight although with a limp.  She reports that nothing makes the pain better or worse.  She has some slight swelling on the lateral aspect of the right foot.  Although she originally was injured about 6 months ago she has not followed up with a specialist and she does not have a local primary care doctor.  She describes the pain as both sharp and aching.     Past Medical History  Diagnosis Date  . Umbilical hernia   . Hypertension   . Bipolar disorder (HCC)   . Scoliosis   . OA (osteoarthritis) of knee   . Abdominal pain   . Leg swelling   . Constipation   . Weakness   . Easy bruising   . Generalized headaches     migraine headaches  . GERD (gastroesophageal reflux disease)   . Anemia   . Diabetes mellitus (HCC)     diet controlled  . Difficulty sleeping   . Autoimmune disease (HCC)   . Neuropathy (HCC)   . Sciatica     Patient Active Problem List   Diagnosis Date Noted  . Obesity (BMI 30-39.9) 07/06/2014  . Avitaminosis D 05/15/2014  . Diabetes (HCC) 05/10/2014  . recurrent ventral hernia 05/10/2014  . Acid reflux 05/07/2014  . ANA positive 05/07/2014  . Leg paresthesia 05/01/2014  . Ankle pain 05/01/2014  . Anxiety 04/03/2014    Past Surgical History  Procedure Laterality Date  . Hernia mesh removal    . Abdominal  hysterectomy    . Knee surgery    . Foot surgery    . Bone spur      rt foot  . Hernia repair    . Ventral hernia repair N/A 07/05/2014    Procedure: LAPRASCOPIC VENTRAL HERNIA REPAIR WITH MESH;  Surgeon: Wenda Low, MD;  Location: [facility] ORS;  Service: General;  Laterality: N/A;  . Insertion of mesh  07/05/2014    Procedure: INSERTION OF MESH;  Surgeon: Wenda Low, MD;  Location: [facility];  Service: General;;    Current Outpatient Rx  Name  Route  Sig  Dispense  Refill  . ferrous sulfate 325 (65 FE) MG tablet   Oral   Take 325 mg by mouth 2 (two) times daily with a meal.         . HYDROcodone-acetaminophen (NORCO/VICODIN) 5-325 MG per tablet   Oral   Take 1 tablet by mouth every 6 (six) hours as needed for moderate pain.   30 tablet   0   . hydrOXYzine (ATARAX/VISTARIL) 25 MG tablet   Oral   Take 25 mg by mouth at bedtime as needed (Sleep).          Marland Kitchen lisinopril (PRINIVIL,ZESTRIL) 10 MG tablet   Oral   Take 10 mg by mouth every morning.          Marland Kitchen  omeprazole (PRILOSEC) 20 MG capsule   Oral   Take 20 mg by mouth daily before breakfast.         . oxyCODONE-acetaminophen (PERCOCET) 7.5-325 MG per tablet   Oral   Take 1 tablet by mouth every 4 (four) hours as needed for pain.           Allergies Aspirin; Celebrex; Ibuprofen; Keflex; Penicillins; Polyurethane; Sulfa antibiotics; and Ultram  No family history on file.  Social History Social History  Substance Use Topics  . Smoking status: Never Smoker   . Smokeless tobacco: Not on file  . Alcohol Use: No    Review of Systems Constitutional: No fever/chills Cardiovascular: Denies chest pain. Respiratory: Denies shortness of breath. Gastrointestinal: No abdominal pain.  No nausea, no vomiting.  No diarrhea.  No constipation. Genitourinary: Negative for dysuria. Musculoskeletal: Pain and slight swelling in right foot.  Negative for back pain. Skin: Negative for rash. Neurological: Negative for headaches, focal  weakness or numbness other than chronic neuropathy.   ____________________________________________   PHYSICAL EXAM:  VITAL SIGNS: ED Triage Vitals  Enc Vitals Group     BP 02/08/16 0026 133/91 mmHg     Pulse Rate 02/08/16 0026 70     Resp 02/08/16 0026 18     Temp 02/08/16 0026 98.9 F (37.2 C)     Temp Source 02/08/16 0026 Oral     SpO2 02/08/16 0026 100 %     Weight 02/08/16 0026 200 lb (90.719 kg)     Height 02/08/16 0026 5' 4.5" (1.638 m)     Head Cir --      Peak Flow --      Pain Score 02/08/16 0027 9     Pain Loc --      Pain Edu? --      Excl. in GC? --     Constitutional: Alert and oriented. Well appearing and in no acute distress. Eyes: Conjunctivae are normal. PERRL. EOMI. Head: Atraumatic. Neck: No stridor.  No meningeal signs.   Cardiovascular: Normal rate, regular rhythm. Good peripheral circulation.  Respiratory: Normal respiratory effort.  No retractions.  Musculoskeletal: Tenderness to palpation throughout the right foot.  Very slight swelling on the top lateral aspect.  No evidence of infection. Neurologic:  Normal speech and language. No gross focal neurologic deficits are appreciated.  Skin:  Skin is warm, dry and intact. No rash noted. Psychiatric: Mood and affect are normal. Speech and behavior are normal.  ____________________________________________   LABS (all labs ordered are listed, but only abnormal results are displayed)  Labs Reviewed - No data to display ____________________________________________  EKG  None ____________________________________________  RADIOLOGY   Dg Foot Complete Right  02/08/2016  CLINICAL DATA:  Right foot pain with ambulation. Heard a pop while walking several days prior. EXAM: RIGHT FOOT COMPLETE - 3+ VIEW COMPARISON:  None. FINDINGS: There is no evidence of fracture or dislocation. There is no evidence of arthropathy or other focal bone abnormality. Small plantar calcaneal spur. Soft tissues are  unremarkable. IMPRESSION: No acute abnormality.  Small plantar calcaneal spur. Electronically Signed   By: Rubye Oaks M.D.   On: 02/08/2016 00:57    ____________________________________________   PROCEDURES  Procedure(s) performed: None  Critical Care performed: No ____________________________________________   INITIAL IMPRESSION / ASSESSMENT AND PLAN / ED COURSE  Pertinent labs & imaging results that were available during my care of the patient were reviewed by me and considered in my medical decision making (see chart for details).  Patient has no acute or emergent medical condition.  X-rays are unremarkable.  We gave her 2 Percocet in the emergency department but I do not believe a prescription would be appropriate.  I counseled RICE and OTC medications with podiatry f/u.  ____________________________________________  FINAL CLINICAL IMPRESSION(S) / ED DIAGNOSES  Final diagnoses:  Right foot pain      NEW MEDICATIONS STARTED DURING THIS VISIT:  Discharge Medication List as of 02/08/2016  3:11 AM        Note:  This document was prepared using Dragon voice recognition software and may include unintentional dictation errors.   Loleta Roseory Hiram Mciver, MD 02/08/16 [phone]

## 2016-02-08 NOTE — ED Notes (Signed)
Pt says she fell on 07/04/15 and injured her right foot; has a contusion to same; pt c/o intermittent pain since; this past week she was walking and heard a "pop"; pt ambulatory with slow gait, favoring foot; declined wheelchair

## 2016-02-08 NOTE — Discharge Instructions (Signed)

## 2016-02-08 NOTE — ED Notes (Signed)
Pt. Going home with family.  Pt. States her brother is picking her up.

## 2016-02-26 ENCOUNTER — Emergency Department: Payer: Self-pay

## 2016-02-26 ENCOUNTER — Emergency Department
Admission: EM | Admit: 2016-02-26 | Discharge: 2016-02-26 | Disposition: A | Discharge status: Home / Self Care | Payer: Self-pay | Attending: Emergency Medicine | Admitting: Emergency Medicine

## 2016-02-26 DIAGNOSIS — E119 Type 2 diabetes mellitus without complications: Secondary | ICD-10-CM | POA: Insufficient documentation

## 2016-02-26 DIAGNOSIS — R109 Unspecified abdominal pain: Secondary | ICD-10-CM

## 2016-02-26 DIAGNOSIS — R1030 Lower abdominal pain, unspecified: Secondary | ICD-10-CM | POA: Insufficient documentation

## 2016-02-26 DIAGNOSIS — G8929 Other chronic pain: Secondary | ICD-10-CM | POA: Insufficient documentation

## 2016-02-26 DIAGNOSIS — I10 Essential (primary) hypertension: Secondary | ICD-10-CM | POA: Insufficient documentation

## 2016-02-26 DIAGNOSIS — F319 Bipolar disorder, unspecified: Secondary | ICD-10-CM | POA: Insufficient documentation

## 2016-02-26 LAB — COMPREHENSIVE METABOLIC PANEL
ALBUMIN: 4.3 g/dL (ref 3.5–5.0)
ALT: 19 U/L (ref 14–54)
ANION GAP: 9 (ref 5–15)
AST: 16 U/L (ref 15–41)
Alkaline Phosphatase: 59 U/L (ref 38–126)
BILIRUBIN TOTAL: 0.5 mg/dL (ref 0.3–1.2)
BUN: 13 mg/dL (ref 6–20)
CO2: 23 mmol/L (ref 22–32)
Calcium: 9.1 mg/dL (ref 8.9–10.3)
Chloride: 107 mmol/L (ref 101–111)
Creatinine, Ser: 0.77 mg/dL (ref 0.44–1.00)
GFR calc Af Amer: >60 mL/min (ref >60)
Glucose, Bld: 97 mg/dL (ref 65–99)
POTASSIUM: 4 mmol/L (ref 3.5–5.1)
Sodium: 139 mmol/L (ref 135–145)
TOTAL PROTEIN: 7.3 g/dL (ref 6.5–8.1)

## 2016-02-26 LAB — URINALYSIS COMPLETE WITH MICROSCOPIC (ARMC ONLY)
Bilirubin Urine: NEGATIVE
Glucose, UA: NEGATIVE mg/dL
Ketones, ur: NEGATIVE mg/dL
LEUKOCYTES UA: NEGATIVE
NITRITE: NEGATIVE
PH: 6 (ref 5.0–8.0)
Protein, ur: NEGATIVE mg/dL
Specific Gravity, Urine: 1.014 (ref 1.005–1.030)

## 2016-02-26 LAB — CBC
HEMATOCRIT: 35.6 % (ref 35.0–47.0)
HEMOGLOBIN: 12.3 g/dL (ref 12.0–16.0)
MCH: 29 pg (ref 26.0–34.0)
MCHC: 34.5 g/dL (ref 32.0–36.0)
MCV: 84 fL (ref 80.0–100.0)
Platelets: 203 10*3/uL (ref 150–440)
RBC: 4.23 MIL/uL (ref 3.80–5.20)
RDW: 14.2 % (ref 11.5–14.5)
WBC: 10.2 10*3/uL (ref 3.6–11.0)

## 2016-02-26 LAB — LIPASE, BLOOD: Lipase: 21 U/L (ref 11–51)

## 2016-02-26 MED ORDER — ONDANSETRON 4 MG PO TBDP: ORAL_TABLET | ORAL | Status: AC

## 2016-02-26 NOTE — Discharge Instructions (Signed)
You have been seen in the Emergency Department (ED) for abdominal pain.  Your evaluation did not identify a clear cause of your symptoms but was generally reassuring.  Please follow up as instructed above regarding todays emergent visit and the symptoms that are bothering you.  As we discussed, we cannot provide narcotics for chronic pain according to the [facility] Chronic Pain policy.  Return to the ED if your abdominal pain worsens or fails to improve, you develop bloody vomiting, bloody diarrhea, you are unable to tolerate fluids due to vomiting, fever greater than 101, or other symptoms that concern you.   Chronic Pain Chronic pain can be defined as pain that is off and on and lasts for 3-6 months or longer. Many things cause chronic pain, which can make it difficult to make a diagnosis. There are many treatment options available for chronic pain. However, finding a treatment that works well for you may require trying various approaches until the right one is found. Many people benefit from a combination of two or more types of treatment to control their pain. SYMPTOMS  Chronic pain can occur anywhere in the body and can range from mild to very severe. Some types of chronic pain include:  Headache.  Low back pain.  Cancer pain.  Arthritis pain.  Neurogenic pain. This is pain resulting from damage to nerves. People with chronic pain may also have other symptoms such as:  Depression.  Anger.  Insomnia.  Anxiety. DIAGNOSIS  Your health care provider will help diagnose your condition over time. In many cases, the initial focus will be on excluding possible conditions that could be causing the pain. Depending on your symptoms, your health care provider may order tests to diagnose your condition. Some of these tests may include:   Blood tests.   CT scan.   MRI.   X-rays.   Ultrasounds.   Nerve conduction studies.  You may need to see a specialist.  TREATMENT    Finding treatment that works well may take time. You may be referred to a pain specialist. He or she may prescribe medicine or therapies, such as:   Mindful meditation or yoga.  Shots (injections) of numbing or pain-relieving medicines into the spine or area of pain.  Local electrical stimulation.  Acupuncture.   Massage therapy.   Aroma, color, light, or sound therapy.   Biofeedback.   Working with a physical therapist to keep from getting stiff.   Regular, gentle exercise.   Cognitive or behavioral therapy.   Group support.  Sometimes, surgery may be recommended.  HOME CARE INSTRUCTIONS   Take all medicines as directed by your health care provider.   Lessen stress in your life by relaxing and doing things such as listening to calming music.   Exercise or be active as directed by your health care provider.   Eat a healthy diet and include things such as vegetables, fruits, fish, and lean meats in your diet.   Keep all follow-up appointments with your health care provider.   Attend a support group with others suffering from chronic pain. SEEK MEDICAL CARE IF:   Your pain gets worse.   You develop a new pain that was not there before.   You cannot tolerate medicines given to you by your health care provider.   You have new symptoms since your last visit with your health care provider.  SEEK IMMEDIATE MEDICAL CARE IF:   You feel weak.   You have decreased sensation or  numbness.   You lose control of bowel or bladder function.   Your pain suddenly gets much worse.   You develop shaking.  You develop chills.  You develop confusion.  You develop chest pain.  You develop shortness of breath.  MAKE SURE YOU:  Understand these instructions.  Will watch your condition.  Will get help right away if you are not doing well or get worse.   This information is not intended to replace advice given to you by your health care provider.  Make sure you discuss any questions you have with your health care provider.   Document Released: 07/10/2002 Document Revised: 06/20/2013 Document Reviewed: 04/13/2013 Elsevier Interactive Patient Education 2016 [employer]   Abdominal Pain, Adult Many things can cause abdominal pain. Usually, abdominal pain is not caused by a disease and will improve without treatment. It can often be observed and treated at home. Your health care provider will do a physical exam and possibly order blood tests and X-rays to help determine the seriousness of your pain. However, in many cases, more time must pass before a clear cause of the pain can be found. Before that point, your health care provider may not know if you need more testing or further treatment. HOME CARE INSTRUCTIONS Monitor your abdominal pain for any changes. The following actions may help to alleviate any discomfort you are experiencing:  Only take over-the-counter or prescription medicines as directed by your health care provider.  Do not take laxatives unless directed to do so by your health care provider.  Try a clear liquid diet (broth, tea, or water) as directed by your health care provider. Slowly move to a bland diet as tolerated. SEEK MEDICAL CARE IF:  You have unexplained abdominal pain.  You have abdominal pain associated with nausea or diarrhea.  You have pain when you urinate or have a bowel movement.  You experience abdominal pain that wakes you in the night.  You have abdominal pain that is worsened or improved by eating food.  You have abdominal pain that is worsened with eating fatty foods.  You have a fever. SEEK IMMEDIATE MEDICAL CARE IF:  Your pain does not go away within 2 hours.  You keep throwing up (vomiting).  Your pain is felt only in portions of the abdomen, such as the right side or the left lower portion of the abdomen.  You pass bloody or black tarry stools. MAKE SURE YOU:  Understand these  instructions.  Will watch your condition.  Will get help right away if you are not doing well or get worse.   This information is not intended to replace advice given to you by your health care provider. Make sure you discuss any questions you have with your health care provider.   Document Released: 07/28/2005 Document Revised: 07/09/2015 Document Reviewed: 06/27/2013 Elsevier Interactive Patient Education [employer]

## 2016-02-26 NOTE — ED Notes (Signed)
Patient ambulatory to triage with steady gait, without difficulty or distress noted; pt reports lower abd pain accomp by N/V x 3 days

## 2016-02-26 NOTE — ED Notes (Addendum)
Pt back from X-ray.  

## 2016-02-26 NOTE — ED Provider Notes (Signed)
[facility] Emergency Department Provider Note  ____________________________________________  Time seen: Approximately 4:52 AM  I have reviewed the triage vital signs and the nursing notes.   HISTORY  Chief Complaint Emesis and Abdominal Pain    HPI Jennifer Barajas is a 45 y.o. female with chronic lower abd pain after a hernia surgery w/ mesh several years ago who presents w/ lower abd pain, N/V/D.  Initially she stated in triage that the symptoms of been present for about 4 days she said they have been constant.  She states she has not been able to eat or drink anything and that all comes back out in chunks.  She states that her stools have been dark like they have many times in the past.  However, when I ask her more about the length and duration of symptoms based specifically on a note in [facility] that I saw where she had called her surgeon's office and reported exactly the same symptoms about 6 weeks ago, she did agree that actually her symptoms of been present for about 6 weeks.  She tried to go to her surgeon's office, but she did not want to pay the $160 that they required for an office visit, so she decided not to seek treatment at that time.  She denies fever/chills, chest pain, shortness of breath.  She reports the pain in her lower abdomen is intermittent but severe, "everything" makes it worse and nothing makes it better, but it does go away on its own.  She denies dysuria and flank pain.  Past Medical History  Diagnosis Date  . Umbilical hernia   . Hypertension   . Bipolar disorder (HCC)   . Scoliosis   . OA (osteoarthritis) of knee   . Abdominal pain   . Leg swelling   . Constipation   . Weakness   . Easy bruising   . Generalized headaches     migraine headaches  . GERD (gastroesophageal reflux disease)   . Anemia   . Diabetes mellitus (HCC)     diet controlled  . Difficulty sleeping   . Autoimmune disease (HCC)   . Neuropathy  (HCC)   . Sciatica     Patient Active Problem List   Diagnosis Date Noted  . Obesity (BMI 30-39.9) 07/06/2014  . Avitaminosis D 05/15/2014  . Diabetes (HCC) 05/10/2014  . recurrent ventral hernia 05/10/2014  . Acid reflux 05/07/2014  . ANA positive 05/07/2014  . Leg paresthesia 05/01/2014  . Ankle pain 05/01/2014  . Anxiety 04/03/2014    Past Surgical History  Procedure Laterality Date  . Hernia mesh removal    . Abdominal hysterectomy    . Knee surgery    . Foot surgery    . Bone spur      rt foot  . Hernia repair    . Ventral hernia repair N/A 07/05/2014    Procedure: LAPRASCOPIC VENTRAL HERNIA REPAIR WITH MESH;  Surgeon: Wenda Low, MD;  Location: [facility];  Service: General;  Laterality: N/A;  . Insertion of mesh  07/05/2014    Procedure: INSERTION OF MESH;  Surgeon: Wenda Low, MD;  Location: [facility];  Service: General;;    Current Outpatient Rx  Name  Route  Sig  Dispense  Refill  . ferrous sulfate 325 (65 FE) MG tablet   Oral   Take 325 mg by mouth 2 (two) times daily with a meal.         . HYDROcodone-acetaminophen (NORCO/VICODIN) 5-325 MG per  tablet   Oral   Take 1 tablet by mouth every 6 (six) hours as needed for moderate pain.   30 tablet   0   . hydrOXYzine (ATARAX/VISTARIL) 25 MG tablet   Oral   Take 25 mg by mouth at bedtime as needed (Sleep).          Marland Kitchen. lisinopril (PRINIVIL,ZESTRIL) 10 MG tablet   Oral   Take 10 mg by mouth every morning.          Marland Kitchen. omeprazole (PRILOSEC) 20 MG capsule   Oral   Take 20 mg by mouth daily before breakfast.         . ondansetron (ZOFRAN ODT) 4 MG disintegrating tablet      Allow 1-2 tablets to dissolve in your mouth every 8 hours as needed for nausea/vomiting   30 tablet   0   . oxyCODONE-acetaminophen (PERCOCET) 7.5-325 MG per tablet   Oral   Take 1 tablet by mouth every 4 (four) hours as needed for pain.           Allergies Aspirin; Celebrex; Ibuprofen; Keflex; Penicillins; Polyurethane; Sulfa  antibiotics; and Ultram  No family history on file.  Social History Social History  Substance Use Topics  . Smoking status: Never Smoker   . Smokeless tobacco: Not on file  . Alcohol Use: No    Review of Systems Constitutional: No fever/chills Eyes: No visual changes. ENT: No sore throat. Cardiovascular: Denies chest pain. Respiratory: Denies shortness of breath. Gastrointestinal: Lower abdominal pain.  +N/V/D w/ dark stools Genitourinary: Negative for dysuria. Musculoskeletal: Negative for back pain. Skin: Negative for rash. Neurological: Negative for headaches, focal weakness or numbness.  10-point ROS otherwise negative.  ____________________________________________   PHYSICAL EXAM:  VITAL SIGNS: ED Triage Vitals  Enc Vitals Group     BP 02/26/16 0153 140/81 mmHg     Pulse Rate 02/26/16 0153 82     Resp 02/26/16 0153 20     Temp 02/26/16 0153 99.3 F (37.4 C)     Temp Source 02/26/16 0153 Oral     SpO2 02/26/16 0153 99 %     Weight 02/26/16 0153 200 lb (90.719 kg)     Height 02/26/16 0153 5\' 4"  (1.626 m)     Head Cir --      Peak Flow --      Pain Score 02/26/16 0244 8     Pain Loc --      Pain Edu? --      Excl. in GC? --     Constitutional: Alert and oriented. Well appearing and in no acute distress. VSS, afebrile. Eyes: Conjunctivae are normal. PERRL. EOMI. Head: Atraumatic. Nose: No congestion/rhinnorhea. Mouth/Throat: Mucous membranes are moist.  Oropharynx non-erythematous. Neck: No stridor.  No meningeal signs.   Cardiovascular: Normal rate, regular rhythm. Good peripheral circulation. Grossly normal heart sounds.   Respiratory: Normal respiratory effort.  No retractions. Lungs CTAB. Gastrointestinal: Obese, soft.  Generalized, non-focal tenderness throughout abdomen.  Well-appearing surgical scars with no subcutaneous induration, erythema/cellulitis, or other evidence of infection or abnormality. Patient refused rectal exam. Musculoskeletal: No  lower extremity tenderness nor edema. No gross deformities of extremities. Neurologic:  Normal speech and language. No gross focal neurologic deficits are appreciated.  Skin:  Skin is warm, dry and intact. No rash noted. Psychiatric: Mood and affect are normal. Speech and behavior are normal.  ____________________________________________   LABS (all labs ordered are listed, but only abnormal results are displayed)  Labs Reviewed  URINALYSIS COMPLETEWITH MICROSCOPIC ([facility] ONLY) - Abnormal; Notable for the following:    Color, Urine YELLOW (*)    APPearance CLEAR (*)    Hgb urine dipstick 1+ (*)    Bacteria, UA RARE (*)    Squamous Epithelial / LPF 0-5 (*)    All other components within normal limits  LIPASE, BLOOD  COMPREHENSIVE METABOLIC PANEL  CBC   ____________________________________________  EKG  None ____________________________________________  RADIOLOGY   Dg Abd Acute W/chest  02/26/2016  CLINICAL DATA:  Persistent chronic abdominal pain in the lower abdomen. History of hernia repair. EXAM: DG ABDOMEN ACUTE W/ 1V CHEST COMPARISON:  08/16/2014 CT of the abdomen FINDINGS: There is no evidence of dilated bowel loops or free intraperitoneal air. No radiopaque calculi or other significant radiographic abnormality is seen. Heart size and mediastinal contours are within normal limits. Both lungs are clear. IMPRESSION: Negative abdominal radiographs.  No acute cardiopulmonary disease. Electronically Signed   By: Marnee Spring M.D.   On: 02/26/2016 05:21    ____________________________________________   PROCEDURES  Procedure(s) performed: None  Critical Care performed: No ____________________________________________   INITIAL IMPRESSION / ASSESSMENT AND PLAN / ED COURSE  Pertinent labs & imaging results that were available during my care of the patient were reviewed by me and considered in my medical decision making (see chart for details).  The patient is  well-appearing, in no acute distress, has normal vitals, and is afebrile.  She has completely normal lab work.  I expressed to her that I am concerned about the chronic nature of her abdominal pain and the fact that she has been worked up multiple times as an outpatient and had multiple CT scans that have all been normal since her last surgical revision.  Also explained that I would anticipate that after nearly 6 weeks of symptoms she would have some lab abnormalities or vital sign abnormalities that would point towards an acute infectious or obstructive process, and that if heard arch stools for 6 weeks were indicative of a gastric intestinal bleed she would have a low hemoglobin, but it is completely within normal limits today.  Her physical exam is reassuring as well.  I explained that I feel that the risk of repeat CT scans every time she feels some discomfort of her chronic pain is higher than the benefit at this time.  Instead, we will obtain an acute abdomen series to make sure there is no sign of obstruction or ileus as well as no free air and around the site of the surgery.  Also explained that I would not be able to treat her chronic abdominal pain with any narcotics although I am happy to provide some antiemetic medication.  She states that she understands and agrees with the plan currently.  ____________________________________________  FINAL CLINICAL IMPRESSION(S) / ED DIAGNOSES  Final diagnoses:  Chronic abdominal pain      NEW MEDICATIONS STARTED DURING THIS VISIT:  Discharge Medication List as of 02/26/2016  5:31 AM    START taking these medications   Details  ondansetron (ZOFRAN ODT) 4 MG disintegrating tablet Allow 1-2 tablets to dissolve in your mouth every 8 hours as needed for nausea/vomiting, Print          Note:  This document was prepared using Dragon voice recognition software and may include unintentional dictation errors.   Loleta Rose, MD 02/26/16 [id number]

## 2016-02-26 NOTE — ED Notes (Signed)
Pt reports she started on Sunday night with N/V/D and umbilical and left lower abdominal pain. Pt states she is having the same symptoms she did when she had an obstruction from a hernia in 2015. Pt states she has had 3 hernia surgeries since 2009.

## 2016-02-26 NOTE — ED Notes (Signed)
[facility] information put in under wrong patient. No transfer for this patient.

## 2016-03-01 ENCOUNTER — Other Ambulatory Visit: Payer: Self-pay

## 2016-03-05 ENCOUNTER — Ambulatory Visit: Payer: Self-pay | Admitting: Surgery

## 2016-04-09 ENCOUNTER — Ambulatory Visit: Payer: Self-pay | Admitting: Surgery

## 2016-04-22 ENCOUNTER — Telehealth: Payer: Self-pay | Admitting: Surgery

## 2016-04-22 ENCOUNTER — Ambulatory Visit: Payer: Self-pay | Admitting: Surgery

## 2016-04-22 NOTE — Telephone Encounter (Signed)
Patient showed up in [place] office for 1:30 appointment with Dr Excell [place] in [place]. She would not drive to [place] to see him. She states she does not know the area and she does not have enough gas to get there. Patient states she wants to see the Dr in the [place] office. I will reschedule appointment for next time Dr Excell Seltzerooper is in the [place] office.

## 2016-04-23 ENCOUNTER — Telehealth: Payer: Self-pay | Admitting: Surgery

## 2016-04-23 NOTE — Telephone Encounter (Signed)
Patient stated she was waiting for Amber to call her back about an appointment

## 2016-04-23 NOTE — Telephone Encounter (Signed)
Per Ami, This has been taken care of.

## 2016-04-30 ENCOUNTER — Ambulatory Visit: Payer: Self-pay | Admitting: Surgery

## 2016-05-24 ENCOUNTER — Ambulatory Visit: Payer: Self-pay | Admitting: Surgery
